# Patient Record
Sex: Female | Born: 1955 | Race: Black or African American | Hispanic: No | Smoking: Never smoker
Health system: Southern US, Community
[De-identification: ages and names within clinical notes are randomized; demographics above are authoritative.]

## PROBLEM LIST (undated history)

## (undated) DIAGNOSIS — I10 Essential (primary) hypertension: Secondary | ICD-10-CM

## (undated) DIAGNOSIS — E7800 Pure hypercholesterolemia, unspecified: Secondary | ICD-10-CM

## (undated) DIAGNOSIS — M791 Myalgia, unspecified site: Secondary | ICD-10-CM

## (undated) DIAGNOSIS — D700 Congenital agranulocytosis: Secondary | ICD-10-CM

## (undated) DIAGNOSIS — D649 Anemia, unspecified: Secondary | ICD-10-CM

## (undated) DIAGNOSIS — F411 Generalized anxiety disorder: Secondary | ICD-10-CM

## (undated) DIAGNOSIS — R7989 Other specified abnormal findings of blood chemistry: Secondary | ICD-10-CM

## (undated) DIAGNOSIS — T7840XA Allergy, unspecified, initial encounter: Secondary | ICD-10-CM

## (undated) DIAGNOSIS — M2550 Pain in unspecified joint: Secondary | ICD-10-CM

## (undated) DIAGNOSIS — R7303 Prediabetes: Secondary | ICD-10-CM

## (undated) HISTORY — DX: Anemia, unspecified: D64.9

## (undated) HISTORY — PX: CHOLECYSTECTOMY: SHX55

## (undated) HISTORY — DX: Pure hypercholesterolemia, unspecified: E78.00

## (undated) HISTORY — DX: Allergy, unspecified, initial encounter: T78.40XA

## (undated) HISTORY — DX: Prediabetes: R73.03

## (undated) HISTORY — DX: Congenital agranulocytosis: D70.0

## (undated) HISTORY — DX: Other specified abnormal findings of blood chemistry: R79.89

## (undated) HISTORY — DX: Pain in unspecified joint: M25.50

## (undated) HISTORY — DX: Generalized anxiety disorder: F41.1

## (undated) HISTORY — DX: Myalgia, unspecified site: M79.10

---

## 1997-11-04 ENCOUNTER — Emergency Department (HOSPITAL_COMMUNITY): Admission: EM | Admit: 1997-11-04 | Discharge: 1997-11-04 | Payer: Self-pay | Admitting: Emergency Medicine

## 1997-11-13 ENCOUNTER — Emergency Department (HOSPITAL_COMMUNITY): Admission: EM | Admit: 1997-11-13 | Discharge: 1997-11-13 | Payer: Self-pay | Admitting: Emergency Medicine

## 1998-03-13 ENCOUNTER — Ambulatory Visit (HOSPITAL_COMMUNITY): Admission: RE | Admit: 1998-03-13 | Discharge: 1998-03-13 | Payer: Self-pay | Admitting: Nephrology

## 1998-03-13 ENCOUNTER — Encounter: Payer: Self-pay | Admitting: Nephrology

## 1998-03-29 ENCOUNTER — Ambulatory Visit (HOSPITAL_COMMUNITY): Admission: RE | Admit: 1998-03-29 | Discharge: 1998-03-29 | Payer: Self-pay | Admitting: Nephrology

## 1998-03-29 ENCOUNTER — Encounter: Payer: Self-pay | Admitting: Nephrology

## 1998-04-10 ENCOUNTER — Ambulatory Visit (HOSPITAL_COMMUNITY): Admission: RE | Admit: 1998-04-10 | Discharge: 1998-04-10 | Payer: Self-pay | Admitting: General Surgery

## 1998-05-22 ENCOUNTER — Ambulatory Visit (HOSPITAL_COMMUNITY): Admission: RE | Admit: 1998-05-22 | Discharge: 1998-05-22 | Payer: Self-pay | Admitting: *Deleted

## 1998-06-21 ENCOUNTER — Encounter (HOSPITAL_BASED_OUTPATIENT_CLINIC_OR_DEPARTMENT_OTHER): Payer: Self-pay | Admitting: General Surgery

## 1998-06-24 ENCOUNTER — Encounter (HOSPITAL_BASED_OUTPATIENT_CLINIC_OR_DEPARTMENT_OTHER): Payer: Self-pay | Admitting: General Surgery

## 1998-06-24 ENCOUNTER — Ambulatory Visit (HOSPITAL_COMMUNITY): Admission: RE | Admit: 1998-06-24 | Discharge: 1998-06-25 | Payer: Self-pay | Admitting: General Surgery

## 1999-10-25 ENCOUNTER — Emergency Department (HOSPITAL_COMMUNITY): Admission: EM | Admit: 1999-10-25 | Discharge: 1999-10-25 | Payer: Self-pay | Admitting: *Deleted

## 1999-10-29 ENCOUNTER — Ambulatory Visit (HOSPITAL_COMMUNITY): Admission: AD | Admit: 1999-10-29 | Discharge: 1999-10-29 | Payer: Self-pay | Admitting: Obstetrics and Gynecology

## 1999-10-29 ENCOUNTER — Encounter (INDEPENDENT_AMBULATORY_CARE_PROVIDER_SITE_OTHER): Payer: Self-pay | Admitting: Specialist

## 1999-10-30 ENCOUNTER — Other Ambulatory Visit: Admission: RE | Admit: 1999-10-30 | Discharge: 1999-10-30 | Payer: Self-pay | Admitting: Obstetrics and Gynecology

## 1999-10-30 ENCOUNTER — Encounter (INDEPENDENT_AMBULATORY_CARE_PROVIDER_SITE_OTHER): Payer: Self-pay | Admitting: Specialist

## 2000-06-03 ENCOUNTER — Other Ambulatory Visit: Admission: RE | Admit: 2000-06-03 | Discharge: 2000-06-03 | Payer: Self-pay | Admitting: *Deleted

## 2000-09-13 ENCOUNTER — Other Ambulatory Visit: Admission: RE | Admit: 2000-09-13 | Discharge: 2000-09-13 | Payer: Self-pay | Admitting: *Deleted

## 2001-06-28 ENCOUNTER — Encounter: Payer: Self-pay | Admitting: Nephrology

## 2001-06-28 ENCOUNTER — Encounter: Admission: RE | Admit: 2001-06-28 | Discharge: 2001-06-28 | Payer: Self-pay | Admitting: Nephrology

## 2002-03-13 ENCOUNTER — Encounter: Payer: Self-pay | Admitting: Internal Medicine

## 2002-03-13 ENCOUNTER — Ambulatory Visit (HOSPITAL_COMMUNITY): Admission: RE | Admit: 2002-03-13 | Discharge: 2002-03-13 | Payer: Self-pay | Admitting: Internal Medicine

## 2002-12-13 ENCOUNTER — Encounter: Payer: Self-pay | Admitting: Internal Medicine

## 2002-12-13 ENCOUNTER — Ambulatory Visit (HOSPITAL_COMMUNITY): Admission: RE | Admit: 2002-12-13 | Discharge: 2002-12-13 | Payer: Self-pay | Admitting: Internal Medicine

## 2003-02-06 ENCOUNTER — Other Ambulatory Visit: Admission: RE | Admit: 2003-02-06 | Discharge: 2003-02-06 | Payer: Self-pay | Admitting: Family Medicine

## 2004-01-02 ENCOUNTER — Ambulatory Visit: Payer: Self-pay | Admitting: Nurse Practitioner

## 2004-01-25 ENCOUNTER — Ambulatory Visit: Payer: Self-pay | Admitting: Nurse Practitioner

## 2004-04-17 ENCOUNTER — Ambulatory Visit: Payer: Self-pay | Admitting: Nurse Practitioner

## 2004-05-02 ENCOUNTER — Ambulatory Visit: Payer: Self-pay | Admitting: Nurse Practitioner

## 2004-05-28 ENCOUNTER — Ambulatory Visit: Payer: Self-pay | Admitting: Nurse Practitioner

## 2004-07-21 ENCOUNTER — Ambulatory Visit: Payer: Self-pay | Admitting: Nurse Practitioner

## 2004-07-21 ENCOUNTER — Other Ambulatory Visit: Admission: RE | Admit: 2004-07-21 | Discharge: 2004-07-21 | Payer: Self-pay | Admitting: Family Medicine

## 2004-09-01 ENCOUNTER — Ambulatory Visit: Payer: Self-pay | Admitting: Nurse Practitioner

## 2005-01-22 ENCOUNTER — Ambulatory Visit: Payer: Self-pay | Admitting: Nurse Practitioner

## 2005-10-09 ENCOUNTER — Ambulatory Visit: Payer: Self-pay | Admitting: Nurse Practitioner

## 2005-10-09 ENCOUNTER — Other Ambulatory Visit: Admission: RE | Admit: 2005-10-09 | Discharge: 2005-10-09 | Payer: Self-pay | Admitting: Family Medicine

## 2005-10-12 ENCOUNTER — Encounter (INDEPENDENT_AMBULATORY_CARE_PROVIDER_SITE_OTHER): Payer: Self-pay | Admitting: Nurse Practitioner

## 2005-10-14 ENCOUNTER — Ambulatory Visit: Payer: Self-pay | Admitting: Nurse Practitioner

## 2006-12-08 ENCOUNTER — Encounter (INDEPENDENT_AMBULATORY_CARE_PROVIDER_SITE_OTHER): Payer: Self-pay | Admitting: *Deleted

## 2006-12-22 ENCOUNTER — Ambulatory Visit: Payer: Self-pay | Admitting: Internal Medicine

## 2006-12-23 ENCOUNTER — Ambulatory Visit (HOSPITAL_COMMUNITY): Admission: RE | Admit: 2006-12-23 | Discharge: 2006-12-23 | Payer: Self-pay | Admitting: Family Medicine

## 2006-12-23 ENCOUNTER — Ambulatory Visit: Payer: Self-pay | Admitting: *Deleted

## 2007-01-19 ENCOUNTER — Ambulatory Visit: Payer: Self-pay | Admitting: Family Medicine

## 2007-01-25 ENCOUNTER — Encounter (INDEPENDENT_AMBULATORY_CARE_PROVIDER_SITE_OTHER): Payer: Self-pay | Admitting: Nurse Practitioner

## 2007-01-25 DIAGNOSIS — E785 Hyperlipidemia, unspecified: Secondary | ICD-10-CM | POA: Insufficient documentation

## 2007-01-25 DIAGNOSIS — D649 Anemia, unspecified: Secondary | ICD-10-CM | POA: Insufficient documentation

## 2007-01-25 DIAGNOSIS — I10 Essential (primary) hypertension: Secondary | ICD-10-CM | POA: Insufficient documentation

## 2007-02-07 ENCOUNTER — Ambulatory Visit: Payer: Self-pay | Admitting: Internal Medicine

## 2007-03-09 ENCOUNTER — Ambulatory Visit: Payer: Self-pay | Admitting: Internal Medicine

## 2007-03-15 ENCOUNTER — Encounter (INDEPENDENT_AMBULATORY_CARE_PROVIDER_SITE_OTHER): Payer: Self-pay | Admitting: Nurse Practitioner

## 2007-03-15 ENCOUNTER — Ambulatory Visit: Payer: Self-pay | Admitting: Internal Medicine

## 2007-03-15 LAB — CONVERTED CEMR LAB
ALT: 16 U/L
AST: 18 U/L
Albumin: 4.3 g/dL
Alkaline Phosphatase: 36 U/L — ABNORMAL LOW
BUN: 16 mg/dL
Basophils Absolute: 0 K/uL
Basophils Relative: 1 %
CO2: 27 meq/L
Calcium: 9.4 mg/dL
Chloride: 104 meq/L
Creatinine, Ser: 0.83 mg/dL
Eosinophils Absolute: 0.1 K/uL — ABNORMAL LOW
Eosinophils Relative: 2 %
Glucose, Bld: 77 mg/dL
HCT: 35.4 % — ABNORMAL LOW
Helicobacter Pylori Antibody-IgG: 1.4 — ABNORMAL HIGH
Hemoglobin: 11.5 g/dL — ABNORMAL LOW
Lymphocytes Relative: 51 % — ABNORMAL HIGH
Lymphs Abs: 2.2 K/uL
MCHC: 32.5 g/dL
MCV: 76.8 fL — ABNORMAL LOW
Monocytes Absolute: 0.5 K/uL
Monocytes Relative: 12 %
Neutro Abs: 1.5 K/uL — ABNORMAL LOW
Neutrophils Relative %: 34 % — ABNORMAL LOW
Platelets: 251 K/uL
Potassium: 3.5 meq/L
RBC: 4.61 M/uL
RDW: 14.5 %
Sodium: 141 meq/L
Total Bilirubin: 0.3 mg/dL
Total Protein: 7.3 g/dL
WBC: 4.3 10*3/microliter

## 2007-04-07 ENCOUNTER — Ambulatory Visit: Payer: Self-pay | Admitting: Family Medicine

## 2007-06-09 ENCOUNTER — Ambulatory Visit: Payer: Self-pay | Admitting: Internal Medicine

## 2007-06-10 ENCOUNTER — Encounter (INDEPENDENT_AMBULATORY_CARE_PROVIDER_SITE_OTHER): Payer: Self-pay | Admitting: Nurse Practitioner

## 2007-06-10 LAB — CONVERTED CEMR LAB
Alkaline Phosphatase: 39 units/L (ref 39–117)
BUN: 18 mg/dL (ref 6–23)
CO2: 28 meq/L (ref 19–32)
Creatinine, Ser: 1 mg/dL (ref 0.40–1.20)
Eosinophils Absolute: 0 10*3/uL (ref 0.0–0.7)
Eosinophils Relative: 1 % (ref 0–5)
Glucose, Bld: 78 mg/dL (ref 70–99)
HCT: 36.8 % (ref 36.0–46.0)
Hemoglobin: 11.4 g/dL — ABNORMAL LOW (ref 12.0–15.0)
Lymphocytes Relative: 52 % — ABNORMAL HIGH (ref 12–46)
Lymphs Abs: 2.7 10*3/uL (ref 0.7–4.0)
MCV: 82 fL (ref 78.0–100.0)
Monocytes Absolute: 0.4 10*3/uL (ref 0.1–1.0)
Platelets: 268 10*3/uL (ref 150–400)
Preg, Serum: NEGATIVE
Sodium: 138 meq/L (ref 135–145)
TSH: 0.622 microintl units/mL (ref 0.350–5.50)
Total Bilirubin: 0.3 mg/dL (ref 0.3–1.2)
Total Protein: 7.4 g/dL (ref 6.0–8.3)
WBC: 5.3 10*3/uL (ref 4.0–10.5)

## 2007-07-12 ENCOUNTER — Encounter (INDEPENDENT_AMBULATORY_CARE_PROVIDER_SITE_OTHER): Payer: Self-pay | Admitting: Nurse Practitioner

## 2007-07-12 ENCOUNTER — Ambulatory Visit: Payer: Self-pay | Admitting: Family Medicine

## 2007-07-12 LAB — CONVERTED CEMR LAB
Cholesterol: 212 mg/dL — ABNORMAL HIGH (ref 0–200)
HDL: 58 mg/dL (ref >39)
LDL Cholesterol: 143 mg/dL — ABNORMAL HIGH (ref 0–99)
Triglycerides: 55 mg/dL (ref <150)

## 2007-10-14 ENCOUNTER — Emergency Department (HOSPITAL_COMMUNITY): Admission: EM | Admit: 2007-10-14 | Discharge: 2007-10-14 | Payer: Self-pay | Admitting: Emergency Medicine

## 2007-12-07 ENCOUNTER — Ambulatory Visit: Payer: Self-pay | Admitting: Internal Medicine

## 2007-12-07 LAB — CONVERTED CEMR LAB
ALT: 21 units/L (ref 0–35)
Alkaline Phosphatase: 41 units/L (ref 39–117)
Basophils Absolute: 0 10*3/uL (ref 0.0–0.1)
CO2: 24 meq/L (ref 19–32)
Creatinine, Ser: 0.91 mg/dL (ref 0.40–1.20)
Eosinophils Absolute: 0.1 10*3/uL (ref 0.0–0.7)
Eosinophils Relative: 2 % (ref 0–5)
Glucose, Bld: 72 mg/dL (ref 70–99)
HCT: 34.8 % — ABNORMAL LOW (ref 36.0–46.0)
Hemoglobin: 10.9 g/dL — ABNORMAL LOW (ref 12.0–15.0)
Lymphocytes Relative: 55 % — ABNORMAL HIGH (ref 12–46)
MCHC: 31.3 g/dL (ref 30.0–36.0)
MCV: 79.6 fL (ref 78.0–100.0)
Monocytes Absolute: 0.4 10*3/uL (ref 0.1–1.0)
Platelets: 234 10*3/uL (ref 150–400)
RDW: 15.5 % (ref 11.5–15.5)
Sodium: 141 meq/L (ref 135–145)
Total Bilirubin: 0.3 mg/dL (ref 0.3–1.2)
Total Protein: 7.3 g/dL (ref 6.0–8.3)

## 2007-12-21 ENCOUNTER — Ambulatory Visit: Payer: Self-pay | Admitting: Internal Medicine

## 2007-12-21 ENCOUNTER — Encounter (INDEPENDENT_AMBULATORY_CARE_PROVIDER_SITE_OTHER): Payer: Self-pay | Admitting: Adult Health

## 2007-12-21 ENCOUNTER — Encounter (INDEPENDENT_AMBULATORY_CARE_PROVIDER_SITE_OTHER): Payer: Self-pay | Admitting: Family Medicine

## 2007-12-21 LAB — CONVERTED CEMR LAB: Microalb, Ur: 0.61 mg/dL (ref 0.00–1.89)

## 2007-12-22 ENCOUNTER — Encounter (INDEPENDENT_AMBULATORY_CARE_PROVIDER_SITE_OTHER): Payer: Self-pay | Admitting: Family Medicine

## 2007-12-27 ENCOUNTER — Ambulatory Visit (HOSPITAL_COMMUNITY): Admission: RE | Admit: 2007-12-27 | Discharge: 2007-12-27 | Payer: Self-pay | Admitting: Family Medicine

## 2008-01-04 ENCOUNTER — Encounter (INDEPENDENT_AMBULATORY_CARE_PROVIDER_SITE_OTHER): Payer: Self-pay | Admitting: Internal Medicine

## 2008-01-04 ENCOUNTER — Ambulatory Visit: Payer: Self-pay | Admitting: Family Medicine

## 2008-01-04 LAB — CONVERTED CEMR LAB
Cholesterol: 169 mg/dL (ref 0–200)
HDL: 60 mg/dL (ref >39)
Total CHOL/HDL Ratio: 2.8
VLDL: 10 mg/dL (ref 0–40)

## 2008-01-09 ENCOUNTER — Ambulatory Visit: Payer: Self-pay | Admitting: Internal Medicine

## 2008-02-02 ENCOUNTER — Ambulatory Visit: Payer: Self-pay | Admitting: Internal Medicine

## 2008-02-03 ENCOUNTER — Encounter (INDEPENDENT_AMBULATORY_CARE_PROVIDER_SITE_OTHER): Payer: Self-pay | Admitting: Adult Health

## 2008-02-14 ENCOUNTER — Ambulatory Visit: Payer: Self-pay | Admitting: Internal Medicine

## 2008-02-27 ENCOUNTER — Encounter (INDEPENDENT_AMBULATORY_CARE_PROVIDER_SITE_OTHER): Payer: Self-pay | Admitting: Adult Health

## 2008-02-27 ENCOUNTER — Ambulatory Visit: Payer: Self-pay | Admitting: Internal Medicine

## 2008-02-27 LAB — CONVERTED CEMR LAB
AST: 29 units/L (ref 0–37)
Alkaline Phosphatase: 39 units/L (ref 39–117)
BUN: 10 mg/dL (ref 6–23)
Basophils Absolute: 0 10*3/uL (ref 0.0–0.1)
Basophils Relative: 1 % (ref 0–1)
Calcium: 9.2 mg/dL (ref 8.4–10.5)
Creatinine, Ser: 0.77 mg/dL (ref 0.40–1.20)
Eosinophils Absolute: 0.1 10*3/uL (ref 0.0–0.7)
Eosinophils Relative: 1 % (ref 0–5)
Hemoglobin: 11.1 g/dL — ABNORMAL LOW (ref 12.0–15.0)
MCHC: 30.8 g/dL (ref 30.0–36.0)
MCV: 77.6 fL — ABNORMAL LOW (ref 78.0–100.0)
Monocytes Absolute: 0.4 10*3/uL (ref 0.1–1.0)
Monocytes Relative: 11 % (ref 3–12)
RBC: 4.64 M/uL (ref 3.87–5.11)
RDW: 15.8 % — ABNORMAL HIGH (ref 11.5–15.5)
Total Bilirubin: 0.3 mg/dL (ref 0.3–1.2)

## 2008-03-19 ENCOUNTER — Ambulatory Visit: Payer: Self-pay | Admitting: Internal Medicine

## 2008-04-19 ENCOUNTER — Ambulatory Visit: Payer: Self-pay | Admitting: Internal Medicine

## 2008-05-21 ENCOUNTER — Ambulatory Visit: Payer: Self-pay | Admitting: Internal Medicine

## 2008-05-21 ENCOUNTER — Encounter (INDEPENDENT_AMBULATORY_CARE_PROVIDER_SITE_OTHER): Payer: Self-pay | Admitting: Adult Health

## 2008-05-21 LAB — CONVERTED CEMR LAB
ALT: 19 units/L (ref 0–35)
BUN: 14 mg/dL (ref 6–23)
CO2: 19 meq/L (ref 19–32)
Calcium: 9.5 mg/dL (ref 8.4–10.5)
Chloride: 104 meq/L (ref 96–112)
Cholesterol: 171 mg/dL (ref 0–200)
Creatinine, Ser: 0.75 mg/dL (ref 0.40–1.20)
Glucose, Bld: 92 mg/dL (ref 70–99)
Total CHOL/HDL Ratio: 3.1
Triglycerides: 40 mg/dL (ref <150)

## 2008-05-22 ENCOUNTER — Ambulatory Visit (HOSPITAL_COMMUNITY): Admission: RE | Admit: 2008-05-22 | Discharge: 2008-05-22 | Payer: Self-pay | Admitting: Internal Medicine

## 2008-06-26 ENCOUNTER — Encounter (INDEPENDENT_AMBULATORY_CARE_PROVIDER_SITE_OTHER): Payer: Self-pay | Admitting: Adult Health

## 2008-06-26 ENCOUNTER — Ambulatory Visit: Payer: Self-pay | Admitting: Internal Medicine

## 2008-06-26 LAB — CONVERTED CEMR LAB
Eosinophils Absolute: 0.1 10*3/uL (ref 0.0–0.7)
Eosinophils Relative: 3 % (ref 0–5)
HCT: 34.5 % — ABNORMAL LOW (ref 36.0–46.0)
Lymphs Abs: 2.2 10*3/uL (ref 0.7–4.0)
MCV: 78.1 fL (ref 78.0–100.0)
Monocytes Relative: 9 % (ref 3–12)
Platelets: 229 10*3/uL (ref 150–400)
RBC: 4.42 M/uL (ref 3.87–5.11)
WBC: 4.2 10*3/uL (ref 4.0–10.5)

## 2008-07-25 ENCOUNTER — Ambulatory Visit: Payer: Self-pay | Admitting: Obstetrics & Gynecology

## 2008-07-26 ENCOUNTER — Encounter: Payer: Self-pay | Admitting: Obstetrics and Gynecology

## 2008-07-26 LAB — CONVERTED CEMR LAB
GC Probe Amp, Genital: NEGATIVE
Hepatitis B Surface Ag: NEGATIVE

## 2008-07-27 ENCOUNTER — Encounter: Payer: Self-pay | Admitting: Obstetrics and Gynecology

## 2008-07-27 LAB — CONVERTED CEMR LAB

## 2009-01-25 ENCOUNTER — Ambulatory Visit (HOSPITAL_COMMUNITY): Admission: RE | Admit: 2009-01-25 | Discharge: 2009-01-25 | Payer: Self-pay | Admitting: Internal Medicine

## 2009-01-28 ENCOUNTER — Encounter (INDEPENDENT_AMBULATORY_CARE_PROVIDER_SITE_OTHER): Payer: Self-pay | Admitting: Adult Health

## 2009-01-28 ENCOUNTER — Ambulatory Visit: Payer: Self-pay | Admitting: Family Medicine

## 2009-01-28 LAB — CONVERTED CEMR LAB
ALT: 19 units/L (ref 0–35)
Albumin: 4.6 g/dL (ref 3.5–5.2)
BUN: 14 mg/dL (ref 6–23)
CO2: 20 meq/L (ref 19–32)
Calcium: 9.8 mg/dL (ref 8.4–10.5)
Chloride: 103 meq/L (ref 96–112)
Cholesterol: 236 mg/dL — ABNORMAL HIGH (ref 0–200)
Creatinine, Ser: 0.84 mg/dL (ref 0.40–1.20)
Eosinophils Absolute: 0 10*3/uL (ref 0.0–0.7)
Eosinophils Relative: 1 % (ref 0–5)
HCT: 40.1 % (ref 36.0–46.0)
Lymphs Abs: 2.1 10*3/uL (ref 0.7–4.0)
MCV: 79.6 fL (ref 78.0–100.0)
Microalb, Ur: 1.35 mg/dL (ref 0.00–1.89)
Monocytes Relative: 10 % (ref 3–12)
RBC: 5.04 M/uL (ref 3.87–5.11)
Total CHOL/HDL Ratio: 4.1
WBC: 4.4 10*3/uL (ref 4.0–10.5)

## 2009-01-29 ENCOUNTER — Ambulatory Visit: Payer: Self-pay | Admitting: Internal Medicine

## 2009-01-31 ENCOUNTER — Encounter (INDEPENDENT_AMBULATORY_CARE_PROVIDER_SITE_OTHER): Payer: Self-pay | Admitting: Adult Health

## 2009-02-01 ENCOUNTER — Ambulatory Visit (HOSPITAL_COMMUNITY): Admission: RE | Admit: 2009-02-01 | Discharge: 2009-02-01 | Payer: Self-pay | Admitting: Internal Medicine

## 2009-02-27 ENCOUNTER — Ambulatory Visit: Payer: Self-pay | Admitting: Internal Medicine

## 2009-02-27 ENCOUNTER — Encounter (INDEPENDENT_AMBULATORY_CARE_PROVIDER_SITE_OTHER): Payer: Self-pay | Admitting: Adult Health

## 2009-02-27 LAB — CONVERTED CEMR LAB
AST: 19 units/L (ref 0–37)
Alkaline Phosphatase: 31 units/L — ABNORMAL LOW (ref 39–117)
BUN: 13 mg/dL (ref 6–23)
Calcium: 9.9 mg/dL (ref 8.4–10.5)
Creatinine, Ser: 0.9 mg/dL (ref 0.40–1.20)
Glucose, Bld: 106 mg/dL — ABNORMAL HIGH (ref 70–99)
HDL: 49 mg/dL (ref >39)
LDL Cholesterol: 93 mg/dL (ref 0–99)
Total CHOL/HDL Ratio: 3.1
Triglycerides: 59 mg/dL (ref <150)

## 2009-04-29 ENCOUNTER — Ambulatory Visit: Payer: Self-pay | Admitting: Family Medicine

## 2009-04-29 LAB — CONVERTED CEMR LAB: Sed Rate: 8 mm/hr (ref 0–22)

## 2009-07-15 ENCOUNTER — Ambulatory Visit: Payer: Self-pay | Admitting: Internal Medicine

## 2009-07-15 ENCOUNTER — Encounter (INDEPENDENT_AMBULATORY_CARE_PROVIDER_SITE_OTHER): Payer: Self-pay | Admitting: Adult Health

## 2009-07-15 LAB — CONVERTED CEMR LAB
Alkaline Phosphatase: 30 units/L — ABNORMAL LOW (ref 39–117)
BUN: 12 mg/dL (ref 6–23)
Basophils Absolute: 0 10*3/uL (ref 0.0–0.1)
Basophils Relative: 1 % (ref 0–1)
CO2: 23 meq/L (ref 19–32)
Cholesterol: 224 mg/dL — ABNORMAL HIGH (ref 0–200)
Creatinine, Ser: 0.78 mg/dL (ref 0.40–1.20)
Eosinophils Absolute: 0.1 10*3/uL (ref 0.0–0.7)
Glucose, Bld: 97 mg/dL (ref 70–99)
HDL: 60 mg/dL (ref >39)
Hemoglobin: 11.6 g/dL — ABNORMAL LOW (ref 12.0–15.0)
LDL Cholesterol: 153 mg/dL — ABNORMAL HIGH (ref 0–99)
MCHC: 32.2 g/dL (ref 30.0–36.0)
MCV: 79.8 fL (ref 78.0–100.0)
Monocytes Absolute: 0.3 10*3/uL (ref 0.1–1.0)
Monocytes Relative: 8 % (ref 3–12)
Neutro Abs: 1 10*3/uL — ABNORMAL LOW (ref 1.7–7.7)
RDW: 15.5 % (ref 11.5–15.5)
Sodium: 139 meq/L (ref 135–145)
Total Bilirubin: 0.3 mg/dL (ref 0.3–1.2)
Total CHOL/HDL Ratio: 3.7
Total Protein: 6.9 g/dL (ref 6.0–8.3)
Triglycerides: 57 mg/dL (ref <150)
VLDL: 11 mg/dL (ref 0–40)

## 2009-07-18 ENCOUNTER — Ambulatory Visit (HOSPITAL_COMMUNITY): Admission: RE | Admit: 2009-07-18 | Discharge: 2009-07-18 | Payer: Self-pay | Admitting: Internal Medicine

## 2009-09-27 ENCOUNTER — Ambulatory Visit: Payer: Self-pay | Admitting: Obstetrics and Gynecology

## 2009-10-02 ENCOUNTER — Ambulatory Visit (HOSPITAL_COMMUNITY): Admission: RE | Admit: 2009-10-02 | Discharge: 2009-10-02 | Payer: Self-pay | Admitting: Family Medicine

## 2009-10-07 ENCOUNTER — Ambulatory Visit: Payer: Self-pay | Admitting: Internal Medicine

## 2009-10-07 ENCOUNTER — Encounter (INDEPENDENT_AMBULATORY_CARE_PROVIDER_SITE_OTHER): Payer: Self-pay | Admitting: Adult Health

## 2009-10-07 LAB — CONVERTED CEMR LAB
ALT: 18 units/L (ref 0–35)
AST: 20 units/L (ref 0–37)
Albumin: 4.3 g/dL (ref 3.5–5.2)
Alkaline Phosphatase: 32 units/L — ABNORMAL LOW (ref 39–117)
BUN: 14 mg/dL (ref 6–23)
Basophils Absolute: 0 10*3/uL (ref 0.0–0.1)
Basophils Relative: 0 % (ref 0–1)
Calcium: 9.9 mg/dL (ref 8.4–10.5)
Chloride: 100 meq/L (ref 96–112)
Eosinophils Absolute: 0 10*3/uL (ref 0.0–0.7)
HDL: 52 mg/dL (ref >39)
LDL Cholesterol: 97 mg/dL (ref 0–99)
MCHC: 32.7 g/dL (ref 30.0–36.0)
MCV: 78.5 fL (ref 78.0–100.0)
Monocytes Relative: 11 % (ref 3–12)
Neutro Abs: 1.4 10*3/uL — ABNORMAL LOW (ref 1.7–7.7)
Neutrophils Relative %: 37 % — ABNORMAL LOW (ref 43–77)
Platelets: 215 10*3/uL (ref 150–400)
Potassium: 3.7 meq/L (ref 3.5–5.3)
RBC: 4.79 M/uL (ref 3.87–5.11)
RDW: 16.3 % — ABNORMAL HIGH (ref 11.5–15.5)
Total CHOL/HDL Ratio: 3.1

## 2009-10-25 ENCOUNTER — Ambulatory Visit: Payer: Self-pay | Admitting: Obstetrics & Gynecology

## 2009-11-08 ENCOUNTER — Ambulatory Visit: Payer: Self-pay | Admitting: Obstetrics & Gynecology

## 2010-01-29 ENCOUNTER — Ambulatory Visit (HOSPITAL_COMMUNITY)
Admission: RE | Admit: 2010-01-29 | Discharge: 2010-01-29 | Payer: Self-pay | Source: Home / Self Care | Admitting: Internal Medicine

## 2010-02-19 ENCOUNTER — Ambulatory Visit (HOSPITAL_COMMUNITY)
Admission: RE | Admit: 2010-02-19 | Discharge: 2010-02-19 | Payer: Self-pay | Source: Home / Self Care | Admitting: Family Medicine

## 2010-04-13 ENCOUNTER — Encounter: Payer: Self-pay | Admitting: Internal Medicine

## 2010-06-08 LAB — POCT PREGNANCY, URINE: Preg Test, Ur: NEGATIVE

## 2010-08-05 NOTE — Group Therapy Note (Signed)
Beverly Kramer, Beverly Kramer NO.:  [id number]   MEDICAL RECORD NO.:  [id number]          PATIENT TYPE:  WOC   LOCATION:  [facility]                   FACILITY:  [facility]   PHYSICIAN:  Odie Sera, D.O.    DATE OF BIRTH:  July 08, 1955   DATE OF SERVICE:                                  CLINIC NOTE   Patient presents as a new patient referred by her PCP, Dr. Rickard Rhymes, for recurrent Candidal vaginitis.  Patient reports that she  has had recurrent yeast infections and vaginal itching over the past 3  months for which she has been treated multiple times with vaginal  suppository creams as well as antifungal pills and reports that Vistaril  is really the only thing that helps with the vaginal itching.  She  currently denies any vaginal itching and states that her symptoms are  relatively well controlled currently.  She also reports an associated  thick, copious, white, vaginal discharge when she has the itching.  She  denies any fevers, chills, or weight loss.   PAST MEDICAL HISTORY:  1. Hyperlipidemia.  2. Hypertension.  3. Iron deficiency anemia.  4. Seasonal allergies.  5. Vitamin D deficiency.  6. She denies any past medical history of diabetes.  7. She also denies any history of sexually transmitted diseases or any      history of abnormal Pap smears.   GYNECOLOGIC HISTORY:  Menarche was at age 63.  She continues to have  periods, which are regular and occur every 30 days, lasting 4 days, and  usually heavy flow.  She denies any history of abnormal Pap smears.   OBSTETRIC HISTORY:  She is a G4, P2-0-2-2.  She had 2 spontaneous  abortions and 2 living children.  She is currently not on any form of  birth control and does not desire to be on any.   PAST SURGICAL HISTORY:  1. Cholecystectomy in 2000.  2. Lipoma resection in 2006 on her right foot.   FAMILY HISTORY:  Father had a heart attack, and there is also a family  history of hypertension.   SOCIAL  HISTORY:  She occasionally drinks alcohol socially.  She denies  any tobacco or drug use.  She has only had 2 sexual partners.  She is  currently monogamous with her fiance over the past 2 years.  She denies  any history of sexual or physical abuse.  Patient is from [place] and has  2 children.   REVIEW OF SYSTEMS:  A 14-point review of systems was reviewed with the  patient and is negative except as noted in the History of Present  Illness.   ALLERGIES:  No known drug allergies.   MEDICATIONS:  Include:  1. Loratadine.  2. Hydrochlorothiazide 25 mg daily.  3. Calcium.  4. Supplemental iron daily.  5. Vitamin D 1000 International Units daily.  6. Vistaril 25 mg as needed for itching.  7. Cod Liver Oil pills as needed.  8. Pravastatin 40 mg daily.   PHYSICAL EXAMINATION:  VITAL SIGNS:  Temperature is 97.6, pulse 83,  blood pressure is 107/76, weight is 171.6  pounds, height is 5 feet 2  inches, respirations 24.  GENERAL:  Patient is an [place] American female.  She is obese but in no  acute distress.  HEENT:  Mucous membranes are moist.  NECK:  Supple, there is no thyromegaly.  LUNGS:  Clear to auscultation bilaterally.  CARDIOVASCULAR:  Regular rate and rhythm, no murmurs.  ABDOMEN:  Obese, soft, nontender, and nondistended.  Bowel sounds are  present.  There is no guarding or rebound.  GU:  Speculum exam reveals normal cervix with no lesions or ulcerations,  minimal milky-white discharge, no lesions or masses in the vaginal  vault, and no lesions on the external vagina.  Bimanual exam was also  nontender and no masses palpated.  EXTREMITIES:  No clubbing and no cyanosis and 2+ peripheral pulses.  SKIN:  No rashes or lesions.   ASSESSMENT:  Recurrent Candidal vaginitis.   PLAN:  We will follow up on wet prep results as well as STD screening,  which included gonorrhea and Chlamydia as well as HIV, RPR, and  hepatitis panel.  We will also want to rule out diabetes mellitus with  a  hemoglobin A1c level today.  We will call the patient when these results  are back and if they are abnormal.  Given that the patient is currently  asymptomatic, we will not prescribe any treatment at the present time.  The patient is to follow up with her PCP on all of her other chronic  medical problems.  She was also encouraged to get a routine screening  colonoscopy since she has never had one.     ______________________________  Binnie Kand, NP    ______________________________  Odie Sera, D.O.    CC/MEDQ  D:  07/25/2008  T:  07/25/2008  Job:  [id number]

## 2010-08-08 NOTE — Op Note (Signed)
[facility]  Patient:    Beverly Kramer, Beverly Kramer                      MRN: [id number] Proc. Date: 10/29/99 Adm. Date:  [id number] Attending:  Leonard Schwartz                           Operative Report  PREOPERATIVE DIAGNOSES:       1. An 11-1/2-weeks gestation.                               2. Inevitable abortion.  POSTOPERATIVE DIAGNOSES:      1. An 11-1/2-weeks gestation.                               2. Inevitable abortion.  PROCEDURE:                    Suction dilatation and evacuation.  SURGEON:                      Janine Limbo, M.D.  ANESTHESIA:                   IV sedation and paracervical block.  INDICATIONS:                  The patient is a 55 year old who presents at 11-1/2-weeks gestation by her last period, but who has only a gestational sac with no fetal pole by ultrasound.  She has had bleeding and cramping, and on examination today she was found to have products of conception protruding from an open cervix.  The patient understands the indications for her procedure, and she accepts the risks of, but not limited to, anesthetic complications, bleeding, infection, and possible damage to the surrounding organs.  FINDINGS:                     The uterus was six weeks size.  The cervix was open and products of conception were present at the os.  The patient is Rh positive.  DESCRIPTION OF PROCEDURE:     The patient was taken to the operating room where she was given medication through her IV line.  The perineum was prepped with Betadine, as was the vagina.  The bladder was drained of urine.  The patient was sterilely draped.  A paracervical block was placed using 10 cc of 1% Xylocaine.  The uterus was sounded to 8.0 cm.  A #8 suction curet was used to evacuate the uterine cavity.  A medium sharp curet was then used to curet the cavity.  The uterus was felt to be clean at the end of our procedure. Hemostasis was noted to be  adequate.  All instruments were removed.  The patient was returned to the supine position, and then taken to the recovery room in stable condition.  FOLLOWUP INSTRUCTIONS:        The patient will take ibuprofen as needed for pain.  She was given a prescription for Vicodin one to two p.o. q.4h. p.r.n. severe pain.  She will return to see Dr. Stefano Gaul in two to three weeks, for a follow-up examination. DD:  10/29/99 TD:  10/30/99 Job: [id number]

## 2010-12-19 LAB — DIFFERENTIAL
Lymphocytes Relative: 16
Lymphs Abs: 1.2
Neutro Abs: 5.5
Neutrophils Relative %: 76

## 2010-12-19 LAB — COMPREHENSIVE METABOLIC PANEL
CO2: 27
Calcium: 9.4
Creatinine, Ser: 0.92
GFR calc non Af Amer: >60
Glucose, Bld: 110 — ABNORMAL HIGH
Total Protein: 7

## 2010-12-19 LAB — URINALYSIS, ROUTINE W REFLEX MICROSCOPIC
Glucose, UA: NEGATIVE
Ketones, ur: NEGATIVE
Nitrite: NEGATIVE
Protein, ur: NEGATIVE
pH: 7.5

## 2010-12-19 LAB — CBC
Hemoglobin: 12.3
MCHC: 33.2
MCV: 77.8 — ABNORMAL LOW
RBC: 4.77
RDW: 16.4 — ABNORMAL HIGH

## 2010-12-19 LAB — URINE MICROSCOPIC-ADD ON

## 2010-12-19 LAB — LIPASE, BLOOD: Lipase: 17

## 2011-01-13 ENCOUNTER — Other Ambulatory Visit (HOSPITAL_COMMUNITY): Payer: Self-pay | Admitting: Family Medicine

## 2011-01-13 DIAGNOSIS — Z1231 Encounter for screening mammogram for malignant neoplasm of breast: Secondary | ICD-10-CM

## 2011-02-02 ENCOUNTER — Ambulatory Visit (HOSPITAL_COMMUNITY)
Admission: RE | Admit: 2011-02-02 | Discharge: 2011-02-02 | Disposition: A | Discharge status: Home / Self Care | Payer: 59 | Source: Ambulatory Visit | Attending: Family Medicine | Admitting: Family Medicine

## 2011-02-02 DIAGNOSIS — Z1231 Encounter for screening mammogram for malignant neoplasm of breast: Secondary | ICD-10-CM | POA: Insufficient documentation

## 2011-04-10 ENCOUNTER — Ambulatory Visit (INDEPENDENT_AMBULATORY_CARE_PROVIDER_SITE_OTHER): Payer: 59

## 2011-04-10 DIAGNOSIS — R52 Pain, unspecified: Secondary | ICD-10-CM

## 2011-04-10 DIAGNOSIS — L0231 Cutaneous abscess of buttock: Secondary | ICD-10-CM

## 2011-04-11 ENCOUNTER — Ambulatory Visit (INDEPENDENT_AMBULATORY_CARE_PROVIDER_SITE_OTHER): Payer: 59

## 2011-04-11 DIAGNOSIS — L0231 Cutaneous abscess of buttock: Secondary | ICD-10-CM

## 2011-05-27 ENCOUNTER — Encounter: Payer: Self-pay | Admitting: Physician Assistant

## 2011-05-27 ENCOUNTER — Ambulatory Visit (INDEPENDENT_AMBULATORY_CARE_PROVIDER_SITE_OTHER): Payer: 59 | Admitting: Physician Assistant

## 2011-05-27 DIAGNOSIS — I10 Essential (primary) hypertension: Secondary | ICD-10-CM

## 2011-05-27 DIAGNOSIS — E782 Mixed hyperlipidemia: Secondary | ICD-10-CM

## 2011-05-27 DIAGNOSIS — Z01419 Encounter for gynecological examination (general) (routine) without abnormal findings: Secondary | ICD-10-CM

## 2011-05-27 DIAGNOSIS — Z0000 Encounter for general adult medical examination without abnormal findings: Secondary | ICD-10-CM

## 2011-05-27 DIAGNOSIS — E785 Hyperlipidemia, unspecified: Secondary | ICD-10-CM

## 2011-05-27 DIAGNOSIS — M79609 Pain in unspecified limb: Secondary | ICD-10-CM

## 2011-05-27 LAB — COMPREHENSIVE METABOLIC PANEL
AST: 31 U/L (ref 0–37)
Albumin: 4.6 g/dL (ref 3.5–5.2)
Alkaline Phosphatase: 42 U/L (ref 39–117)
BUN: 16 mg/dL (ref 6–23)
Calcium: 9.7 mg/dL (ref 8.4–10.5)
Chloride: 103 mEq/L (ref 96–112)
Potassium: 3.9 mEq/L (ref 3.5–5.3)
Sodium: 138 mEq/L (ref 135–145)
Total Protein: 7.8 g/dL (ref 6.0–8.3)

## 2011-05-27 LAB — POCT URINALYSIS DIPSTICK
Glucose, UA: NEGATIVE
Nitrite, UA: NEGATIVE
Protein, UA: NEGATIVE
Spec Grav, UA: 1.025
Urobilinogen, UA: 0.2
pH, UA: 7

## 2011-05-27 LAB — LIPID PANEL
HDL: 60 mg/dL (ref >39)
LDL Cholesterol: 140 mg/dL — ABNORMAL HIGH (ref 0–99)
Triglycerides: 38 mg/dL (ref <150)

## 2011-05-27 LAB — TSH: TSH: 1.389 u[IU]/mL (ref 0.350–4.500)

## 2011-05-27 MED ORDER — SIMVASTATIN 40 MG PO TABS: 40.0000 mg | ORAL_TABLET | Freq: Every evening | ORAL | Status: DC

## 2011-05-27 MED ORDER — FLUTICASONE PROPIONATE 50 MCG/ACT NA SUSP: 2.0000 | Freq: Every day | NASAL | Status: DC

## 2011-05-27 MED ORDER — HYDROCHLOROTHIAZIDE 25 MG PO TABS: 25.0000 mg | ORAL_TABLET | Freq: Every day | ORAL | Status: DC

## 2011-05-27 NOTE — Patient Instructions (Signed)
Age-appropriate guidance given 

## 2011-05-27 NOTE — Progress Notes (Signed)
  Subjective:    Patient ID: Beverly Kramer, female    DOB: February 27, 1956, 56 y.o.   MRN: [id number]  HPI  See scanned in sheet.  History of B foot surgery in 2006.  I'm unclear exactly what she had done.  She would like to be referred to see a foot specialist.  UTD on MMG, colonoscopy.  Review of Systems see scanned in form     Objective:   Physical Exam  Nursing note and vitals reviewed. Constitutional: She is oriented to person, place, and time. She appears well-developed and well-nourished.  HENT:  Head: Normocephalic and atraumatic.  Right Ear: External ear normal.  Left Ear: External ear normal.  Mouth/Throat: Oropharynx is clear and moist. No oropharyngeal exudate (PND present).       Turbinates pale and boggy  Eyes: Conjunctivae and EOM are normal. Pupils are equal, round, and reactive to light.  Neck: Normal range of motion. Neck supple. No thyromegaly present.  Cardiovascular: Normal rate, regular rhythm and normal heart sounds.  Exam reveals no gallop and no friction rub.   No murmur heard. Pulmonary/Chest: Effort normal and breath sounds normal. Right breast exhibits no inverted nipple, no mass, no nipple discharge, no skin change and no tenderness. Left breast exhibits no inverted nipple, no mass, no nipple discharge, no skin change and no tenderness.  Abdominal: Soft. Bowel sounds are normal. She exhibits no distension and no mass. There is no tenderness. There is no rebound and no guarding.  Genitourinary: Vagina normal and uterus normal. No vaginal discharge found.       Patient's menses is ending, small amount of blood in vault. Bimanual exam unremarkable.  Pap smear taken  Musculoskeletal: Normal range of motion. She exhibits no edema and no tenderness.  Lymphadenopathy:       Head (right side): No tonsillar, no preauricular and no occipital adenopathy present.       Head (left side): No tonsillar, no preauricular and no occipital adenopathy present.    She has no  cervical adenopathy.    She has no axillary adenopathy.       Right: No supraclavicular adenopathy present.       Left: No supraclavicular adenopathy present.  Neurological: She is alert and oriented to person, place, and time. No cranial nerve deficit. Coordination normal.  Skin: Skin is warm and dry.  Psychiatric: She has a normal mood and affect. Her behavior is normal.          Assessment & Plan:  CPE-set up bone density.  Due MMG in summer/fall Hypertension-stable Hypercholesterolemia-stable Will refer for feet.

## 2011-05-28 LAB — CBC WITH DIFFERENTIAL/PLATELET
Basophils Absolute: 0 10*3/uL (ref 0.0–0.1)
HCT: 36.4 % (ref 36.0–46.0)
Hemoglobin: 11.5 g/dL — ABNORMAL LOW (ref 12.0–15.0)
Lymphocytes Relative: 57 % — ABNORMAL HIGH (ref 12–46)
Lymphs Abs: 1.8 10*3/uL (ref 0.7–4.0)
Monocytes Absolute: 0.3 10*3/uL (ref 0.1–1.0)
Monocytes Relative: 9 % (ref 3–12)
Neutro Abs: 1 10*3/uL — ABNORMAL LOW (ref 1.7–7.7)
RBC: 4.74 MIL/uL (ref 3.87–5.11)
RDW: 16.5 % — ABNORMAL HIGH (ref 11.5–15.5)
WBC: 3.1 10*3/uL — ABNORMAL LOW (ref 4.0–10.5)

## 2011-05-29 LAB — PAP IG W/ RFLX HPV ASCU

## 2011-06-26 ENCOUNTER — Ambulatory Visit (HOSPITAL_COMMUNITY)
Admission: RE | Admit: 2011-06-26 | Discharge: 2011-06-26 | Disposition: A | Discharge status: Home / Self Care | Payer: 59 | Source: Ambulatory Visit | Attending: Physician Assistant | Admitting: Physician Assistant

## 2011-06-26 DIAGNOSIS — Z0000 Encounter for general adult medical examination without abnormal findings: Secondary | ICD-10-CM

## 2011-08-28 ENCOUNTER — Ambulatory Visit (INDEPENDENT_AMBULATORY_CARE_PROVIDER_SITE_OTHER): Payer: 59 | Admitting: Family Medicine

## 2011-08-28 ENCOUNTER — Encounter: Payer: Self-pay | Admitting: Family Medicine

## 2011-08-28 VITALS — BP 135/83 | HR 75 | Temp 98.4°F | Resp 16 | Ht 62.0 in | Wt 158.4 lb

## 2011-08-28 DIAGNOSIS — R21 Rash and other nonspecific skin eruption: Secondary | ICD-10-CM

## 2011-08-28 DIAGNOSIS — L0291 Cutaneous abscess, unspecified: Secondary | ICD-10-CM

## 2011-08-28 DIAGNOSIS — L0390 Cellulitis, unspecified: Secondary | ICD-10-CM

## 2011-08-28 MED ORDER — SULFAMETHOXAZOLE-TRIMETHOPRIM 800-160 MG PO TABS: 1.0000 | ORAL_TABLET | Freq: Two times a day (BID) | ORAL | Status: AC

## 2011-08-28 NOTE — Patient Instructions (Addendum)
Keep lesion clean.  Soaks in salt water   If you get more places or this one gets worse return.

## 2011-08-28 NOTE — Progress Notes (Signed)
Subjective: Patient is here complaining of a rash between her legs. She has a regular partner but he has not had any lesions. She had an I&D in January for a MRSA abscess.  Objective: Cystic abscess of left upper medial thigh, a less and she seems in diameter, tender but not severely inflamed. Distal to that is a area that has a little sore tomorrow dry peeling skin where the abscess has resolved. On her buttock there is ab nodular skin lesion where the previous I&D was done apparently  Assessment: Cyst, presumably MRSA, left medial thigh  Plan: I&D Culture culture Antibiotics Return if abscess persists, or taking the antibiotic has not helped the area that is bumpy

## 2011-09-01 ENCOUNTER — Encounter: Payer: Self-pay | Admitting: Family Medicine

## 2011-09-01 LAB — WOUND CULTURE

## 2012-01-25 ENCOUNTER — Other Ambulatory Visit: Payer: Self-pay | Admitting: Physician Assistant

## 2012-01-26 ENCOUNTER — Telehealth: Payer: Self-pay

## 2012-01-26 NOTE — Telephone Encounter (Signed)
Pt states her pharmacy, [facility], has sent over refill request twice electronically for "high blood pressure pills" No response from [facility], pt is out and states needs now Please call pt at [phone]

## 2012-01-27 NOTE — Telephone Encounter (Signed)
We sent this RX in on the 01/25/12. I called pt she states the pharmacy didn't receive RX. I called the pharmacy and they have Rx. Advised pt to call

## 2012-02-08 ENCOUNTER — Other Ambulatory Visit: Payer: Self-pay | Admitting: Physician Assistant

## 2012-02-08 DIAGNOSIS — Z1231 Encounter for screening mammogram for malignant neoplasm of breast: Secondary | ICD-10-CM

## 2012-02-25 ENCOUNTER — Ambulatory Visit (HOSPITAL_COMMUNITY)
Admission: RE | Admit: 2012-02-25 | Discharge: 2012-02-25 | Disposition: A | Discharge status: Home / Self Care | Payer: 59 | Source: Ambulatory Visit | Attending: Physician Assistant | Admitting: Physician Assistant

## 2012-02-25 DIAGNOSIS — Z1231 Encounter for screening mammogram for malignant neoplasm of breast: Secondary | ICD-10-CM | POA: Insufficient documentation

## 2012-02-26 ENCOUNTER — Ambulatory Visit: Payer: 59

## 2012-02-26 ENCOUNTER — Ambulatory Visit (INDEPENDENT_AMBULATORY_CARE_PROVIDER_SITE_OTHER): Payer: 59 | Admitting: Internal Medicine

## 2012-02-26 VITALS — BP 120/80 | HR 70 | Temp 98.3°F | Resp 16 | Ht 61.0 in | Wt 159.6 lb

## 2012-02-26 DIAGNOSIS — M79671 Pain in right foot: Secondary | ICD-10-CM

## 2012-02-26 DIAGNOSIS — I10 Essential (primary) hypertension: Secondary | ICD-10-CM

## 2012-02-26 DIAGNOSIS — M25569 Pain in unspecified knee: Secondary | ICD-10-CM

## 2012-02-26 DIAGNOSIS — L853 Xerosis cutis: Secondary | ICD-10-CM

## 2012-02-26 DIAGNOSIS — E785 Hyperlipidemia, unspecified: Secondary | ICD-10-CM

## 2012-02-26 MED ORDER — SIMVASTATIN 40 MG PO TABS: 40.0000 mg | ORAL_TABLET | Freq: Every evening | ORAL | Status: DC

## 2012-02-26 MED ORDER — MELOXICAM 15 MG PO TABS: 15.0000 mg | ORAL_TABLET | Freq: Every day | ORAL | Status: DC

## 2012-02-26 MED ORDER — HYDROCHLOROTHIAZIDE 25 MG PO TABS: 25.0000 mg | ORAL_TABLET | Freq: Every day | ORAL | Status: DC

## 2012-02-26 NOTE — Progress Notes (Signed)
  Subjective:    Patient ID: Beverly Kramer, female    DOB: [birth year], 56 y.o.   MRN: [id number]  HPI complaining of one month of right sided knee pain following a fall directly on concrete after tripping No swelling/no give way sensation/pain with stairs/pain with walking/pain with getting up from sitting or squatting/no prior injury  Also continues to complain of bilateral foot pain-see prior evaluation/declined podiatry she was sent to Has ongoing problems they get worse at work  History of hypertension-needs medication refill/note last office visit  Denies chest pain palpitations or peripheral edema  History of hyperlipidemia-needs medication refills/not currently fasting  Complaining of dry skin with itching all over-present for many years without treatment   Review of Systems No fever chills or night sweats No weight loss No headaches or visual changes No cardiac or respiratory symptoms No gastrointestinal symptoms No genitourinary symptoms    Objective:   Physical Exam Vital signs stable HEENT clear Right knee with abrasion over patella The knee has a full range of motion There is no instability to stressors McMurray negative Lachman negative bAllotment of patellar intact Mildly tender to pressure over the infra-patellar tendon  [facility] reading (PRIMARY) by  Dr. Merla Riches no bony abnormality      Assessment & Plan:   1. Knee pain  DG Knee Complete 4 Views Right  2. Foot pain, bilateral    3. HTN (hypertension)    4. Other and unspecified hyperlipidemia    5. Dry skin     Meds ordered this encounter  Medications  . Multiple Vitamins-Minerals (MULTIVITAMIN WITH MINERALS) tablet    Sig: Take 1 tablet by mouth daily.  . hydrochlorothiazide (HYDRODIURIL) 25 MG tablet    Sig: Take 1 tablet (25 mg total) by mouth daily.    Dispense:  90 tablet    Refill:  0  . simvastatin (ZOCOR) 40 MG tablet    Sig: Take 1 tablet (40 mg total) by mouth every evening.   Dispense:  90 tablet    Refill:  1  . meloxicam (MOBIC) 15 MG tablet    Sig: Take 1 tablet (15 mg total) by mouth daily.    Dispense:  30 tablet    Refill:  0   2 followup for eval at 104 in 2 months for fasting lab work Exercises given for infrapatellar abrasion with resulting tendinitis If not well in 3 weeks consider physical therapy 2 use adequate moisturizer for dry skin

## 2012-02-26 NOTE — Patient Instructions (Signed)
curel for skin

## 2012-03-02 NOTE — Progress Notes (Signed)
Left msg for pt to call to schedule January f/u appt with Marylene Land. Lennon Alstrom

## 2012-03-04 NOTE — Progress Notes (Signed)
Sent pt a reminder letter to schedule follow up appt.Beverly Kramer

## 2012-04-01 ENCOUNTER — Encounter: Payer: Self-pay | Admitting: Family Medicine

## 2012-04-01 ENCOUNTER — Other Ambulatory Visit: Payer: Self-pay | Admitting: Family Medicine

## 2012-04-01 ENCOUNTER — Ambulatory Visit (INDEPENDENT_AMBULATORY_CARE_PROVIDER_SITE_OTHER): Payer: 59 | Admitting: Family Medicine

## 2012-04-01 VITALS — BP 160/100 | HR 71 | Temp 98.6°F | Resp 16 | Ht 60.5 in | Wt 164.0 lb

## 2012-04-01 DIAGNOSIS — I10 Essential (primary) hypertension: Secondary | ICD-10-CM

## 2012-04-01 DIAGNOSIS — E785 Hyperlipidemia, unspecified: Secondary | ICD-10-CM

## 2012-04-01 DIAGNOSIS — D649 Anemia, unspecified: Secondary | ICD-10-CM

## 2012-04-01 DIAGNOSIS — Z23 Encounter for immunization: Secondary | ICD-10-CM

## 2012-04-01 DIAGNOSIS — J40 Bronchitis, not specified as acute or chronic: Secondary | ICD-10-CM

## 2012-04-01 DIAGNOSIS — Z79899 Other long term (current) drug therapy: Secondary | ICD-10-CM

## 2012-04-01 DIAGNOSIS — L0291 Cutaneous abscess, unspecified: Secondary | ICD-10-CM

## 2012-04-01 LAB — CBC WITH DIFFERENTIAL/PLATELET
Basophils Absolute: 0 10*3/uL (ref 0.0–0.1)
Eosinophils Absolute: 0.1 10*3/uL (ref 0.0–0.7)
Eosinophils Relative: 2 % (ref 0–5)
HCT: 36.3 % (ref 36.0–46.0)
MCH: 25.2 pg — ABNORMAL LOW (ref 26.0–34.0)
MCHC: 32.8 g/dL (ref 30.0–36.0)
MCV: 76.7 fL — ABNORMAL LOW (ref 78.0–100.0)
Monocytes Absolute: 0.3 10*3/uL (ref 0.1–1.0)
Platelets: 250 10*3/uL (ref 150–400)
RDW: 17 % — ABNORMAL HIGH (ref 11.5–15.5)

## 2012-04-01 LAB — COMPREHENSIVE METABOLIC PANEL
AST: 25 U/L (ref 0–37)
BUN: 14 mg/dL (ref 6–23)
Calcium: 9.7 mg/dL (ref 8.4–10.5)
Chloride: 103 mEq/L (ref 96–112)
Creat: 0.71 mg/dL (ref 0.50–1.10)
Total Bilirubin: 0.5 mg/dL (ref 0.3–1.2)

## 2012-04-01 LAB — LIPID PANEL
Cholesterol: 165 mg/dL (ref 0–200)
HDL: 56 mg/dL (ref >39)
LDL Cholesterol: 102 mg/dL — ABNORMAL HIGH (ref 0–99)
Total CHOL/HDL Ratio: 2.9 Ratio
Triglycerides: 36 mg/dL (ref <150)
VLDL: 7 mg/dL (ref 0–40)

## 2012-04-01 MED ORDER — FLUTICASONE PROPIONATE 50 MCG/ACT NA SUSP: 2.0000 | Freq: Every day | NASAL | Status: DC

## 2012-04-01 MED ORDER — SIMVASTATIN 40 MG PO TABS: 40.0000 mg | ORAL_TABLET | Freq: Every evening | ORAL | Status: DC

## 2012-04-01 MED ORDER — CHLORHEXIDINE GLUCONATE 4 % EX LIQD: 1.0000 "application " | Freq: Every day | CUTANEOUS | Status: AC

## 2012-04-01 MED ORDER — BENZONATATE 200 MG PO CAPS: 200.0000 mg | ORAL_CAPSULE | Freq: Three times a day (TID) | ORAL | Status: DC | PRN

## 2012-04-01 MED ORDER — MUPIROCIN CALCIUM 2 % NA OINT: TOPICAL_OINTMENT | Freq: Two times a day (BID) | NASAL | Status: DC

## 2012-04-01 MED ORDER — HYDROCHLOROTHIAZIDE 25 MG PO TABS: 25.0000 mg | ORAL_TABLET | Freq: Every day | ORAL | Status: DC

## 2012-04-01 MED ORDER — DOXYCYCLINE HYCLATE 100 MG PO TABS: 100.0000 mg | ORAL_TABLET | Freq: Two times a day (BID) | ORAL | Status: DC

## 2012-04-01 NOTE — Progress Notes (Signed)
  Subjective:    Patient ID: Beverly Kramer, female    DOB: 13-May-1955, 57 y.o.   MRN: [id number] Chief Complaint  Patient presents with  . Hypertension  . Medication Refill  . Hyperlipidemia    HPI  Cold for 2 wks, no f/c, no sore throat, no nasal congestion, cough non-productive but feeling ill, tired, no abd pain C/o rash.  Still getting periods monthly. Taking mvi and iron supp once daily.  Review of Systems    BP 160/100  Pulse 71  Temp(Src) 98.6 F (37 C) (Oral)  Resp 16  Ht 5' 0.5" (1.537 m)  Wt 164 lb (74.39 kg)  BMI 31.49 kg/m2  SpO2 100%  LMP 04/22/2011 Objective:   Physical Exam        Assessment & Plan:  Abscess - Plan: doxycycline (VIBRA-TABS) 100 MG tablet, chlorhexidine (HIBICLENS) 4 % external liquid, mupirocin nasal ointment (BACTROBAN) 2 %  HYPERTENSION - Plan: Comprehensive metabolic panel, hydrochlorothiazide (HYDRODIURIL) 25 MG tablet  DYSLIPIDEMIA - Plan: Lipid panel, simvastatin (ZOCOR) 40 MG tablet  Encounter for long-term (current) use of other medications  ANEMIA-NOS - Plan: CBC with Differential  Bronchitis - Plan: fluticasone (FLONASE) 50 MCG/ACT nasal spray, benzonatate (TESSALON) 200 MG capsule  Need for prophylactic vaccination and inoculation against influenza - Plan: Flu vaccine greater than or equal to 3yo with preservative IM  Meds ordered this encounter  Medications  . IRON PO    Sig: Take by mouth daily.  Marland Kitchen BIOTIN PO    Sig: Take 10,000 mcg by mouth.  . doxycycline (VIBRA-TABS) 100 MG tablet    Sig: Take 1 tablet (100 mg total) by mouth 2 (two) times daily.    Dispense:  20 tablet    Refill:  0  . chlorhexidine (HIBICLENS) 4 % external liquid    Sig: Apply 15 mLs (1 application total) topically daily. For 5 days    Dispense:  473 mL    Refill:  0  . mupirocin nasal ointment (BACTROBAN) 2 %    Sig: Place into the nose 2 (two) times daily. Use one-half of tube in each nare x 5d. press sides of nose together and  massage after applying.    Dispense:  10 g    Refill:  0  . fluticasone (FLONASE) 50 MCG/ACT nasal spray    Sig: Place 2 sprays into the nose daily.    Dispense:  16 g    Refill:  6  . hydrochlorothiazide (HYDRODIURIL) 25 MG tablet    Sig: Take 1 tablet (25 mg total) by mouth daily.    Dispense:  90 tablet    Refill:  1  . simvastatin (ZOCOR) 40 MG tablet    Sig: Take 1 tablet (40 mg total) by mouth every evening.    Dispense:  90 tablet    Refill:  1  . benzonatate (TESSALON) 200 MG capsule    Sig: Take 1 capsule (200 mg total) by mouth 3 (three) times daily as needed for cough.    Dispense:  30 capsule    Refill:  0

## 2012-04-05 ENCOUNTER — Encounter: Payer: Self-pay | Admitting: *Deleted

## 2012-07-06 ENCOUNTER — Ambulatory Visit (INDEPENDENT_AMBULATORY_CARE_PROVIDER_SITE_OTHER): Payer: 59 | Admitting: Family Medicine

## 2012-07-06 ENCOUNTER — Telehealth: Payer: Self-pay

## 2012-07-06 VITALS — BP 139/90 | HR 75 | Temp 98.0°F | Resp 16 | Ht 61.0 in | Wt 163.0 lb

## 2012-07-06 DIAGNOSIS — I10 Essential (primary) hypertension: Secondary | ICD-10-CM

## 2012-07-06 DIAGNOSIS — M7741 Metatarsalgia, right foot: Secondary | ICD-10-CM

## 2012-07-06 DIAGNOSIS — M2140 Flat foot [pes planus] (acquired), unspecified foot: Secondary | ICD-10-CM

## 2012-07-06 DIAGNOSIS — M7750 Other enthesopathy of unspecified foot: Secondary | ICD-10-CM

## 2012-07-06 MED ORDER — HYDROCHLOROTHIAZIDE 25 MG PO TABS: 25.0000 mg | ORAL_TABLET | Freq: Every day | ORAL | Status: DC

## 2012-07-06 NOTE — Telephone Encounter (Signed)
Pt is calling because she is in pain and is wanting to be referred to another dr.

## 2012-07-06 NOTE — Telephone Encounter (Signed)
Left message for her to call me back. 

## 2012-07-06 NOTE — Telephone Encounter (Signed)
Where is she painful? Where does she want to be referred?

## 2012-07-06 NOTE — Progress Notes (Signed)
Chief complaint: Bilateral foot pain  History of present illness: The patient has had foot pain and swelling for quite some time. Patient states that she notices that at the end of a long day. 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 Patient states that she can be on her feet for the full 10 hour shift with less than 15 minutes of sitting. Patient denies any injury to the feet. Patient denies any swelling of the cast. Patient denies any shortness of breath or chest pain. Patient has tried some meloxicam which has been beneficial. Patient denies any numbness, and tingling but unfortunately has dull chronic pain.  Past medical history, social, surgical and family history all reviewed.   Physical exam Blood pressure 139/90, pulse 75, temperature 98 F (36.7 C), temperature source Oral, resp. rate 16, height 5\' 1"  (1.549 m), weight 163 lb (73.936 kg), SpO2 97.00%. General: No apparent distress alert and oriented x3 mood and affect normal Respiratory: Patient's speak in full sentences and does not appear short of breath Skin: Warm dry intact with no signs of infection or rash Neuro: Cranial nerves II through XII are intact, neurovascularly intact in all extremities with 2+ DTRs and 2+ pulses. Foot exam: On inspection patient does have severe pes planus with over pronation of the hindfoot bilaterally right greater than left. Patient does have some mild dorsal swelling but nothing significant. Patient has no swelling of the ankle and has full range of motion. She is neurovascularly intact distally with 2+ Achilles tendon reflex.  Assessment: Pes planus, metatarsalgia.  Plan: arch straps given          reccommended orthotics  Home exercises given, discussed icing protocol  on the way out patient wwanted to have refill of HTN meds, did refill but will follow up with PCP within the next month.

## 2012-07-06 NOTE — Telephone Encounter (Signed)
Patient is having pain in foot and would like to be referred to a foot specialist.  i recommended that she come into clinic for evaluation and discuss with the physician where she should be referred to.  She will be coming in today.

## 2012-07-06 NOTE — Telephone Encounter (Signed)
Patient states she will come in for the foot pain.

## 2012-07-06 NOTE — Patient Instructions (Addendum)
Very nice to meet you I am giving you some arch straps that will help keep your feet in a more neutral position.  I also want you to go get some Spenco orthotics.  They cost about $30 dollars.  You can get them at [employer].  I also am gving you some exercises I would like you to do daily.  You should ice your feet for 20 minutes two times a day. You can take a little motrin if you need it.  Come back and see Dr. Clelia Croft for your other problems.

## 2012-07-19 ENCOUNTER — Other Ambulatory Visit: Payer: Self-pay | Admitting: Physician Assistant

## 2012-09-28 ENCOUNTER — Other Ambulatory Visit: Payer: Self-pay | Admitting: Family Medicine

## 2012-09-28 ENCOUNTER — Ambulatory Visit
Admission: RE | Admit: 2012-09-28 | Discharge: 2012-09-28 | Disposition: A | Discharge status: Home / Self Care | Payer: 59 | Source: Ambulatory Visit | Attending: Family Medicine | Admitting: Family Medicine

## 2012-09-28 DIAGNOSIS — M549 Dorsalgia, unspecified: Secondary | ICD-10-CM

## 2012-10-07 ENCOUNTER — Encounter: Payer: 59 | Admitting: Family Medicine

## 2012-12-01 ENCOUNTER — Other Ambulatory Visit (HOSPITAL_COMMUNITY)
Admission: RE | Admit: 2012-12-01 | Discharge: 2012-12-01 | Disposition: A | Discharge status: Home / Self Care | Payer: 59 | Source: Ambulatory Visit | Attending: Nurse Practitioner | Admitting: Nurse Practitioner

## 2012-12-01 ENCOUNTER — Other Ambulatory Visit: Payer: Self-pay | Admitting: Nurse Practitioner

## 2012-12-01 DIAGNOSIS — Z01419 Encounter for gynecological examination (general) (routine) without abnormal findings: Secondary | ICD-10-CM | POA: Insufficient documentation

## 2012-12-01 DIAGNOSIS — Z1151 Encounter for screening for human papillomavirus (HPV): Secondary | ICD-10-CM | POA: Insufficient documentation

## 2012-12-12 ENCOUNTER — Emergency Department (HOSPITAL_COMMUNITY)
Admission: EM | Admit: 2012-12-12 | Discharge: 2012-12-12 | Disposition: A | Discharge status: Home / Self Care | Payer: 59 | Attending: Emergency Medicine | Admitting: Emergency Medicine

## 2012-12-12 ENCOUNTER — Encounter (HOSPITAL_COMMUNITY): Payer: Self-pay | Admitting: *Deleted

## 2012-12-12 DIAGNOSIS — Y9389 Activity, other specified: Secondary | ICD-10-CM | POA: Insufficient documentation

## 2012-12-12 DIAGNOSIS — T148XXA Other injury of unspecified body region, initial encounter: Secondary | ICD-10-CM

## 2012-12-12 DIAGNOSIS — S0990XA Unspecified injury of head, initial encounter: Secondary | ICD-10-CM | POA: Insufficient documentation

## 2012-12-12 DIAGNOSIS — Y92410 Unspecified street and highway as the place of occurrence of the external cause: Secondary | ICD-10-CM | POA: Insufficient documentation

## 2012-12-12 DIAGNOSIS — I10 Essential (primary) hypertension: Secondary | ICD-10-CM | POA: Insufficient documentation

## 2012-12-12 DIAGNOSIS — Z79899 Other long term (current) drug therapy: Secondary | ICD-10-CM | POA: Insufficient documentation

## 2012-12-12 HISTORY — DX: Essential (primary) hypertension: I10

## 2012-12-12 MED ORDER — HYDROCODONE-ACETAMINOPHEN 5-325 MG PO TABS: 1.0000 | ORAL_TABLET | ORAL | Status: DC | PRN

## 2012-12-12 MED ORDER — METHOCARBAMOL 750 MG PO TABS: 750.0000 mg | ORAL_TABLET | Freq: Four times a day (QID) | ORAL | Status: DC
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
## 2013-02-10 ENCOUNTER — Other Ambulatory Visit (HOSPITAL_COMMUNITY): Payer: Self-pay | Admitting: Family Medicine

## 2013-02-10 DIAGNOSIS — Z1231 Encounter for screening mammogram for malignant neoplasm of breast: Secondary | ICD-10-CM

## 2013-02-27 ENCOUNTER — Ambulatory Visit (HOSPITAL_COMMUNITY)
Admission: RE | Admit: 2013-02-27 | Discharge: 2013-02-27 | Disposition: A | Discharge status: Home / Self Care | Payer: 59 | Source: Ambulatory Visit | Attending: Family Medicine | Admitting: Family Medicine

## 2013-02-27 DIAGNOSIS — Z1231 Encounter for screening mammogram for malignant neoplasm of breast: Secondary | ICD-10-CM | POA: Insufficient documentation

## 2014-07-13 ENCOUNTER — Other Ambulatory Visit (HOSPITAL_COMMUNITY): Payer: Self-pay | Admitting: Family Medicine

## 2014-07-13 DIAGNOSIS — Z1231 Encounter for screening mammogram for malignant neoplasm of breast: Secondary | ICD-10-CM

## 2014-07-18 ENCOUNTER — Ambulatory Visit (HOSPITAL_COMMUNITY)
Admission: RE | Admit: 2014-07-18 | Discharge: 2014-07-18 | Disposition: A | Discharge status: Home / Self Care | Payer: 59 | Source: Ambulatory Visit | Attending: Family Medicine | Admitting: Family Medicine

## 2014-07-18 DIAGNOSIS — Z1231 Encounter for screening mammogram for malignant neoplasm of breast: Secondary | ICD-10-CM | POA: Diagnosis present

## 2015-08-13 ENCOUNTER — Other Ambulatory Visit: Payer: Self-pay

## 2015-08-13 DIAGNOSIS — Z1231 Encounter for screening mammogram for malignant neoplasm of breast: Secondary | ICD-10-CM

## 2015-09-03 ENCOUNTER — Ambulatory Visit: Payer: Self-pay

## 2015-09-23 ENCOUNTER — Other Ambulatory Visit: Payer: Self-pay | Admitting: Family Medicine

## 2015-09-23 DIAGNOSIS — Z1231 Encounter for screening mammogram for malignant neoplasm of breast: Secondary | ICD-10-CM

## 2015-10-03 ENCOUNTER — Ambulatory Visit
Admission: RE | Admit: 2015-10-03 | Discharge: 2015-10-03 | Disposition: A | Discharge status: Home / Self Care | Payer: BLUE CROSS/BLUE SHIELD | Source: Ambulatory Visit | Attending: Family Medicine | Admitting: Family Medicine

## 2015-10-03 DIAGNOSIS — Z1231 Encounter for screening mammogram for malignant neoplasm of breast: Secondary | ICD-10-CM

## 2015-10-21 ENCOUNTER — Ambulatory Visit
Admission: RE | Admit: 2015-10-21 | Discharge: 2015-10-21 | Disposition: A | Discharge status: Home / Self Care | Payer: No Typology Code available for payment source | Source: Ambulatory Visit | Attending: Infectious Disease | Admitting: Infectious Disease

## 2015-10-21 ENCOUNTER — Other Ambulatory Visit: Payer: Self-pay | Admitting: Infectious Disease

## 2015-10-21 DIAGNOSIS — A150 Tuberculosis of lung: Secondary | ICD-10-CM

## 2016-03-27 ENCOUNTER — Ambulatory Visit
Admission: RE | Admit: 2016-03-27 | Discharge: 2016-03-27 | Disposition: A | Discharge status: Home / Self Care | Payer: BLUE CROSS/BLUE SHIELD | Source: Ambulatory Visit | Attending: Family Medicine | Admitting: Family Medicine

## 2016-03-27 ENCOUNTER — Other Ambulatory Visit: Payer: Self-pay | Admitting: Family Medicine

## 2016-03-27 DIAGNOSIS — Z227 Latent tuberculosis: Secondary | ICD-10-CM

## 2016-09-21 ENCOUNTER — Other Ambulatory Visit: Payer: Self-pay | Admitting: Family Medicine

## 2016-09-21 DIAGNOSIS — Z1231 Encounter for screening mammogram for malignant neoplasm of breast: Secondary | ICD-10-CM

## 2016-10-05 ENCOUNTER — Ambulatory Visit
Admission: RE | Admit: 2016-10-05 | Discharge: 2016-10-05 | Disposition: A | Discharge status: Home / Self Care | Payer: BLUE CROSS/BLUE SHIELD | Source: Ambulatory Visit | Attending: Family Medicine | Admitting: Family Medicine

## 2016-10-05 ENCOUNTER — Encounter: Payer: Self-pay | Admitting: Radiology

## 2016-10-05 DIAGNOSIS — Z1231 Encounter for screening mammogram for malignant neoplasm of breast: Secondary | ICD-10-CM

## 2016-11-06 ENCOUNTER — Other Ambulatory Visit: Payer: Self-pay | Admitting: Family Medicine

## 2016-11-06 ENCOUNTER — Other Ambulatory Visit (HOSPITAL_COMMUNITY)
Admission: RE | Admit: 2016-11-06 | Discharge: 2016-11-06 | Disposition: A | Discharge status: Home / Self Care | Payer: BLUE CROSS/BLUE SHIELD | Source: Ambulatory Visit | Attending: Family Medicine | Admitting: Family Medicine

## 2016-11-06 DIAGNOSIS — Z01411 Encounter for gynecological examination (general) (routine) with abnormal findings: Secondary | ICD-10-CM | POA: Diagnosis not present

## 2016-11-10 LAB — CYTOLOGY - PAP
Diagnosis: NEGATIVE
HPV: NOT DETECTED

## 2016-12-09 ENCOUNTER — Ambulatory Visit (INDEPENDENT_AMBULATORY_CARE_PROVIDER_SITE_OTHER): Payer: BLUE CROSS/BLUE SHIELD

## 2016-12-09 ENCOUNTER — Ambulatory Visit (INDEPENDENT_AMBULATORY_CARE_PROVIDER_SITE_OTHER): Payer: BLUE CROSS/BLUE SHIELD | Admitting: Podiatry

## 2016-12-09 DIAGNOSIS — M79672 Pain in left foot: Secondary | ICD-10-CM | POA: Diagnosis not present

## 2016-12-09 DIAGNOSIS — M79671 Pain in right foot: Secondary | ICD-10-CM

## 2016-12-09 DIAGNOSIS — M722 Plantar fascial fibromatosis: Secondary | ICD-10-CM

## 2016-12-09 MED ORDER — NONFORMULARY OR COMPOUNDED ITEM: 0 refills | Status: AC

## 2016-12-13 NOTE — Progress Notes (Signed)
   Subjective: Patient is a 61 year old female presented today as a new patient with a complaint of intermittent burning pain and tenderness in the plantar aspect of bilateral heels that began about 6 months ago. Patient states that it hurts in the mornings with the first steps out of bed. Patient presents today for further treatment and evaluation.   Past Medical History:  Diagnosis Date  . Allergy   . Hypertension     Objective: Physical Exam General: The patient is alert and oriented x3 in no acute distress.  Dermatology: Skin is warm, dry and supple bilateral lower extremities. Negative for open lesions or macerations bilateral.   Vascular: Dorsalis Pedis and Posterior Tibial pulses palpable bilateral.  Capillary fill time is immediate to all digits.  Neurological: Epicritic and protective threshold intact bilateral.   Musculoskeletal: Tenderness to palpation at the medial calcaneal tubercale and through the insertion of the plantar fascia of the right foot. All other joints range of motion within normal limits bilateral. Strength 5/5 in all groups bilateral.   Radiographic exam: Normal osseous mineralization. Joint spaces preserved. No fracture/dislocation/boney destruction. Calcaneal spur present with mild thickening of plantar fascia right. No other soft tissue abnormalities or radiopaque foreign bodies.   Assessment: 1. Plantar fasciitis right 2. Pain in right foot  Plan of Care:  1. Patient evaluated. Xrays reviewed.   2. Patient declined injection stating they did not help in the past. 3. Plantar fascial brace dispensed. 4. Cannot tolerate oral NSAIDs. 5. Prescription for anti-inflammatory pain cream to be dispensed from [facility]. 6. Appointment with Raiford Noble for custom molded orthotics. 7. Return to clinic when necessary.   Felecia Shelling, DPM [facility]  Dr. Felecia Shelling, DPM    [address] [place], [place] [address]                Office [phone]  Fax [phone]

## 2016-12-15 ENCOUNTER — Other Ambulatory Visit: Payer: BLUE CROSS/BLUE SHIELD | Admitting: Orthotics

## 2016-12-16 ENCOUNTER — Ambulatory Visit (INDEPENDENT_AMBULATORY_CARE_PROVIDER_SITE_OTHER): Payer: BLUE CROSS/BLUE SHIELD | Admitting: Orthotics

## 2016-12-16 DIAGNOSIS — M722 Plantar fascial fibromatosis: Secondary | ICD-10-CM | POA: Diagnosis not present

## 2016-12-17 NOTE — Progress Notes (Signed)

## 2016-12-29 ENCOUNTER — Other Ambulatory Visit: Payer: Self-pay | Admitting: Podiatry

## 2016-12-29 DIAGNOSIS — M722 Plantar fascial fibromatosis: Secondary | ICD-10-CM

## 2017-01-06 ENCOUNTER — Ambulatory Visit: Payer: BLUE CROSS/BLUE SHIELD | Admitting: Orthotics

## 2017-01-06 DIAGNOSIS — M722 Plantar fascial fibromatosis: Secondary | ICD-10-CM

## 2017-01-06 NOTE — Progress Notes (Signed)
Patient came in today to pick up custom made foot orthotics.  The goals were accomplished and the patient reported no dissatisfaction with said orthotics.  Patient was advised of breakin period and how to report any issues. 

## 2017-10-12 ENCOUNTER — Ambulatory Visit
Admission: RE | Admit: 2017-10-12 | Discharge: 2017-10-12 | Disposition: A | Discharge status: Home / Self Care | Payer: BLUE CROSS/BLUE SHIELD | Source: Ambulatory Visit | Attending: Family Medicine | Admitting: Family Medicine

## 2017-10-12 ENCOUNTER — Other Ambulatory Visit: Payer: Self-pay | Admitting: Family Medicine

## 2017-10-12 DIAGNOSIS — Z1231 Encounter for screening mammogram for malignant neoplasm of breast: Secondary | ICD-10-CM

## 2017-10-20 DIAGNOSIS — M65312 Trigger thumb, left thumb: Secondary | ICD-10-CM | POA: Insufficient documentation

## 2017-10-20 DIAGNOSIS — M19041 Primary osteoarthritis, right hand: Secondary | ICD-10-CM | POA: Insufficient documentation

## 2018-01-22 DIAGNOSIS — M25562 Pain in left knee: Secondary | ICD-10-CM | POA: Insufficient documentation

## 2018-02-24 DIAGNOSIS — M542 Cervicalgia: Secondary | ICD-10-CM | POA: Insufficient documentation

## 2018-04-22 ENCOUNTER — Other Ambulatory Visit: Payer: Self-pay

## 2018-04-22 ENCOUNTER — Ambulatory Visit (INDEPENDENT_AMBULATORY_CARE_PROVIDER_SITE_OTHER): Payer: BLUE CROSS/BLUE SHIELD | Admitting: Family Medicine

## 2018-04-22 ENCOUNTER — Encounter: Payer: Self-pay | Admitting: Family Medicine

## 2018-04-22 VITALS — BP 150/90 | HR 75 | Temp 98.5°F | Ht 62.0 in | Wt 172.0 lb

## 2018-04-22 DIAGNOSIS — K0889 Other specified disorders of teeth and supporting structures: Secondary | ICD-10-CM | POA: Diagnosis not present

## 2018-04-22 DIAGNOSIS — H6122 Impacted cerumen, left ear: Secondary | ICD-10-CM

## 2018-04-22 DIAGNOSIS — M25562 Pain in left knee: Secondary | ICD-10-CM

## 2018-04-22 DIAGNOSIS — H9203 Otalgia, bilateral: Secondary | ICD-10-CM

## 2018-04-22 MED ORDER — AMOXICILLIN 500 MG PO CAPS: 500.0000 mg | ORAL_CAPSULE | Freq: Three times a day (TID) | ORAL | 0 refills | Status: AC

## 2018-04-22 NOTE — Progress Notes (Signed)
Subjective:    Patient ID: Beverly Kramer, female    DOB: [birth year], 63 y.o.   MRN: [id number]  HPI Beverly Kramer is a 63 y.o. female Presents today for: Chief Complaint  Patient presents with  . Otalgia    1 month off and on  . Knee Pain    ongoing. worse last night.   Otalgia: Pain in both ears for 1 month.  Feels like drainage with swallowing or chewing, but no external d/c  Filling in tooth on lower left awhile ago. Treated by dentist in October. Treated with antibiotic, but not followed up with dentist. Swelling in face at times with chewing. Looked swollen last night. No measured fevers.  Hearing ok.   L knee pain: No fall / injury.  3 months of symptoms.  Occasional alleve, or ibuprofen.  Ortho has injected twice - only helped 30 mins to 1 hour.  Has seen ortho, but not recently. Recommended MRI, but has not had done. [employer], and unable to follow up with prior ortho. Needs new referral.  Next appt with new PCP 05/12/18.      Patient Active Problem List   Diagnosis Date Noted  . DYSLIPIDEMIA 01/25/2007  . ANEMIA-NOS 01/25/2007  . HYPERTENSION 01/25/2007   Past Medical History:  Diagnosis Date  . Allergy   . Hypertension    Past Surgical History:  Procedure Laterality Date  . CHOLECYSTECTOMY     per pt 10+ years ago   No Known Allergies Prior to Admission medications   Medication Sig Start Date End Date Taking? Authorizing Provider  amLODipine (NORVASC) 5 MG tablet Take 5 mg by mouth daily.   Yes [provider]  Cinnamon 500 MG capsule Cinnamon   Yes [provider]  hydrochlorothiazide (HYDRODIURIL) 25 MG tablet TAKE 1 TABLET BY MOUTH ONCE DAILY FOR 90 DAYS 04/18/18  Yes [provider]  losartan (COZAAR) 100 MG tablet TAKE 1 TABLET BY MOUTH ONCE DAILY FOR 90 DAYS 04/18/18  Yes [provider]  losartan-hydrochlorothiazide (HYZAAR) 100-25 MG tablet  09/18/16  Yes [provider]  meloxicam  (MOBIC) 15 MG tablet meloxicam 15 mg tablet   Yes [provider]  NONFORMULARY OR COMPOUNDED ITEM [facility]  [place] Tendonitis Cream- Diclofenac 3%, Baclofen 2%, Bupivacaine 1%, Doxepin 5%, Gabapentin 6%, Ibuprofen 3%, Pentoxifylline 3% Apply 1-2 grams to affected area 3-4 times daily Qty. 120 gm 3 refills 12/09/16  Yes Felecia ShellingEvans, Brent M, DPM  rifampin (RIFADIN) 300 MG capsule Take by mouth.   Yes [provider]  simvastatin (ZOCOR) 40 MG tablet simvastatin 40 mg tablet 08/18/17  Yes [provider]   Social History   Socioeconomic History  . Marital status: Divorced    Spouse name: Not on file  . Number of children: Not on file  . Years of education: Not on file  . Highest education level: Not on file  Occupational History  . Not on file  Social Needs  . Financial resource strain: Not on file  . Food insecurity:    Worry: Not on file    Inability: Not on file  . Transportation needs:    Medical: Not on file    Non-medical: Not on file  Tobacco Use  . Smoking status: Never Smoker  . Smokeless tobacco: Never Used  Substance and Sexual Activity  . Alcohol use: No  . Drug use: No  . Sexual activity: Not on file  Lifestyle  . Physical activity:  Days per week: Not on file    Minutes per session: Not on file  . Stress: Not on file  Relationships  . Social connections:    Talks on phone: Not on file    Gets together: Not on file    Attends religious service: Not on file    Active member of club or organization: Not on file    Attends meetings of clubs or organizations: Not on file    Relationship status: Not on file  . Intimate partner violence:    Fear of current or ex partner: Not on file    Emotionally abused: Not on file    Physically abused: Not on file    Forced sexual activity: Not on file  Other Topics Concern  . Not on file  Social History Narrative  . Not on file     Review of Systems Per HPI     Objective:    Physical Exam Vitals signs reviewed.  Constitutional:      General: She is not in acute distress.    Appearance: She is well-developed.  HENT:     Head: Normocephalic and atraumatic.     Right Ear: Hearing, tympanic membrane, ear canal and external ear normal.     Left Ear: Hearing, ear canal and external ear normal. There is impacted cerumen.     Nose: Nose normal.     Mouth/Throat:     Pharynx: No oropharyngeal exudate.     Comments: Small area of soft tissue swelling approximately 4 to 5 mm wide lateral/buccal mucosal side of second premolar on the right upper.  No active discharge, no discharge from around the base of the tooth.  No obvious/apparent dental caries at present.  Jaw nontender, no clicking/clunk with TMJ, TMJ nontender.  Pain-free range of motion of jaw.  No apparent facial swelling noted Eyes:     Conjunctiva/sclera: Conjunctivae normal.     Pupils: Pupils are equal, round, and reactive to light.  Neck:     Musculoskeletal: No muscular tenderness.  Cardiovascular:     Rate and Rhythm: Normal rate and regular rhythm.     Heart sounds: Normal heart sounds. No murmur.  Pulmonary:     Effort: Pulmonary effort is normal. No respiratory distress.     Breath sounds: Normal breath sounds. No wheezing or rhonchi.  Musculoskeletal:     Left knee: She exhibits decreased range of motion (Flexion 90 degrees, full extension.  ) and effusion (Trace to 1+.). She exhibits no ecchymosis and no erythema. Tenderness found. Medial joint line tenderness noted.  Lymphadenopathy:     Cervical: No cervical adenopathy.  Skin:    General: Skin is warm and dry.     Findings: No rash.  Neurological:     Mental Status: She is alert and oriented to person, place, and time.  Psychiatric:        Behavior: Behavior normal.    After successful lavage on left ear, TM visualized with minimal clear to slightly opaque.  The base but no significant retraction, erythema, or bulging.  Canal  otherwise appears normal     Assessment & Plan:    Beverly Kramer is a 64 y.o. female Otalgia of both ears Impacted cerumen of left ear - Plan: Ear wax removal  -Symptoms may be related to eustachian tube dysfunction versus cerumen that was removed.  Trial Flonase recommended, RTC precautions if symptoms persist/worsen  Pain, dental - Plan: amoxicillin (AMOXIL) 500 MG capsule  -Possible  abscess discussed and need for follow-up with dentist.  Temporarily started on amoxicillin but again stressed importance of dental follow-up with RTC/ER precautions  Left knee pain, unspecified chronicity - Plan: Ambulatory referral to Orthopedic Surgery  -Ongoing left knee pain with previous treatment as above.  New referral placed to orthopedics  Meds ordered this encounter  Medications  . amoxicillin (AMOXIL) 500 MG capsule    Sig: Take 1 capsule (500 mg total) by mouth 3 (three) times daily.    Dispense:  30 capsule    Refill:  0   Patient Instructions   I do not see an obvious ear infection at this time.  Try Flonase nasal spray 1 to 2 sprays at bedtime to see if that may help with some of the drainage feeling form the eustachian tube. More info below.  If the symptoms or not improving in the next week, return for recheck or we can refer you to ear nose and throat.  Please call your dentist right away to evaluate the swollen area on your right upper gum.  I did start amoxicillin antibiotic to cover for right now but you will need to be seen as soon as possible by your dentist.  I will refer you to orthopedics for your knee pain.  Return to the clinic or go to the nearest emergency room if any of your symptoms worsen or new symptoms occur.   Eustachian Tube Dysfunction  Eustachian tube dysfunction refers to a condition in which a blockage develops in the narrow passage that connects the middle ear to the back of the nose (eustachian tube). The eustachian tube regulates air pressure in the  middle ear by letting air move between the ear and nose. It also helps to drain fluid from the middle ear space. Eustachian tube dysfunction can affect one or both ears. When the eustachian tube does not function properly, air pressure, fluid, or both can build up in the middle ear. What are the causes? This condition occurs when the eustachian tube becomes blocked or cannot open normally. Common causes of this condition include:  Ear infections.  Colds and other infections that affect the nose, mouth, and throat (upper respiratory tract).  Allergies.  Irritation from cigarette smoke.  Irritation from stomach acid coming up into the esophagus (gastroesophageal reflux). The esophagus is the tube that carries food from the mouth to the stomach.  Sudden changes in air pressure, such as from descending in an airplane or scuba diving.  Abnormal growths in the nose or throat, such as: ? Growths that line the nose (nasal polyps). ? Abnormal growth of cells (tumors). ? Enlarged tissue at the back of the throat (adenoids). What increases the risk? You are more likely to develop this condition if:  You smoke.  You are overweight.  You are a child who has: ? Certain birth defects of the mouth, such as cleft palate. ? Large tonsils or adenoids. What are the signs or symptoms? Common symptoms of this condition include:  A feeling of fullness in the ear.  Ear pain.  Clicking or popping noises in the ear.  Ringing in the ear.  Hearing loss.  Loss of balance.  Dizziness. Symptoms may get worse when the air pressure around you changes, such as when you travel to an area of high elevation, fly on an airplane, or go scuba diving. How is this diagnosed? This condition may be diagnosed based on:  Your symptoms.  A physical exam of your ears, nose,  and throat.  Tests, such as those that measure: ? The movement of your eardrum (tympanogram). ? Your hearing (audiometry). How is this  treated? Treatment depends on the cause and severity of your condition.  In mild cases, you may relieve your symptoms by moving air into your ears. This is called "popping the ears."  In more severe cases, or if you have symptoms of fluid in your ears, treatment may include: ? Medicines to relieve congestion (decongestants). ? Medicines that treat allergies (antihistamines). ? Nasal sprays or ear drops that contain medicines that reduce swelling (steroids). ? A procedure to drain the fluid in your eardrum (myringotomy). In this procedure, a small tube is placed in the eardrum to:  Drain the fluid.  Restore the air in the middle ear space. ? A procedure to insert a balloon device through the nose to inflate the opening of the eustachian tube (balloon dilation). Follow these instructions at home: Lifestyle  Do not do any of the following until your health care provider approves: ? Travel to high altitudes. ? Fly in airplanes. ? Work in a Estate agent or room. ? Scuba dive.  Do not use any products that contain nicotine or tobacco, such as cigarettes and e-cigarettes. If you need help quitting, ask your health care provider.  Keep your ears dry. Wear fitted earplugs during showering and bathing. Dry your ears completely after. General instructions  Take over-the-counter and prescription medicines only as told by your health care provider.  Use techniques to help pop your ears as recommended by your health care provider. These may include: ? Chewing gum. ? Yawning. ? Frequent, forceful swallowing. ? Closing your mouth, holding your nose closed, and gently blowing as if you are trying to blow air out of your nose.  Keep all follow-up visits as told by your health care provider. This is important. Contact a health care provider if:  Your symptoms do not go away after treatment.  Your symptoms come back after treatment.  You are unable to pop your ears.  You have: ? A  fever. ? Pain in your ear. ? Pain in your head or neck. ? Fluid draining from your ear.  Your hearing suddenly changes.  You become very dizzy.  You lose your balance. Summary  Eustachian tube dysfunction refers to a condition in which a blockage develops in the eustachian tube.  It can be caused by ear infections, allergies, inhaled irritants, or abnormal growths in the nose or throat.  Symptoms include ear pain, hearing loss, or ringing in the ears.  Mild cases are treated with maneuvers to unblock the ears, such as yawning or ear popping.  Severe cases are treated with medicines. Surgery may also be done (rare). This information is not intended to replace advice given to you by your health care provider. Make sure you discuss any questions you have with your health care provider. Document Released: 04/05/2015 Document Revised: 06/29/2017 Document Reviewed: 06/29/2017 Elsevier Interactive Patient Education  [employer].    If you have lab work done today you will be contacted with your lab results within the next 2 weeks.  If you have not heard from [place] then please contact us. The fastest way to get your results is to register for My Chart.   IF you received an x-ray today, you will receive an invoice from [facility]. Please contact [facility] at [phone] with questions or concerns regarding your invoice.   IF you received labwork today, you  will receive an invoice from [facility]. Please contact [facility] at [phone] with questions or concerns regarding your invoice.   Our billing staff will not be able to assist you with questions regarding bills from these companies.  You will be contacted with the lab results as soon as they are available. The fastest way to get your results is to activate your My Chart account. Instructions are located on the last page of this paperwork. If you have not heard from [place] regarding the results in 2 weeks, please  contact this office.       Signed,   Meredith Staggers, MD [facility].  04/26/18 6:30 PM

## 2018-04-22 NOTE — Patient Instructions (Addendum)
I do not see an obvious ear infection at this time.  Try Flonase nasal spray 1 to 2 sprays at bedtime to see if that may help with some of the drainage feeling form the eustachian tube. More info below.  If the symptoms or not improving in the next week, return for recheck or we can refer you to ear nose and throat.  Please call your dentist right away to evaluate the swollen area on your right upper gum.  I did start amoxicillin antibiotic to cover for right now but you will need to be seen as soon as possible by your dentist.  I will refer you to orthopedics for your knee pain.  Return to the clinic or go to the nearest emergency room if any of your symptoms worsen or new symptoms occur.   Eustachian Tube Dysfunction  Eustachian tube dysfunction refers to a condition in which a blockage develops in the narrow passage that connects the middle ear to the back of the nose (eustachian tube). The eustachian tube regulates air pressure in the middle ear by letting air move between the ear and nose. It also helps to drain fluid from the middle ear space. Eustachian tube dysfunction can affect one or both ears. When the eustachian tube does not function properly, air pressure, fluid, or both can build up in the middle ear. What are the causes? This condition occurs when the eustachian tube becomes blocked or cannot open normally. Common causes of this condition include:  Ear infections.  Colds and other infections that affect the nose, mouth, and throat (upper respiratory tract).  Allergies.  Irritation from cigarette smoke.  Irritation from stomach acid coming up into the esophagus (gastroesophageal reflux). The esophagus is the tube that carries food from the mouth to the stomach.  Sudden changes in air pressure, such as from descending in an airplane or scuba diving.  Abnormal growths in the nose or throat, such as: ? Growths that line the nose (nasal polyps). ? Abnormal growth of cells  (tumors). ? Enlarged tissue at the back of the throat (adenoids). What increases the risk? You are more likely to develop this condition if:  You smoke.  You are overweight.  You are a child who has: ? Certain birth defects of the mouth, such as cleft palate. ? Large tonsils or adenoids. What are the signs or symptoms? Common symptoms of this condition include:  A feeling of fullness in the ear.  Ear pain.  Clicking or popping noises in the ear.  Ringing in the ear.  Hearing loss.  Loss of balance.  Dizziness. Symptoms may get worse when the air pressure around you changes, such as when you travel to an area of high elevation, fly on an airplane, or go scuba diving. How is this diagnosed? This condition may be diagnosed based on:  Your symptoms.  A physical exam of your ears, nose, and throat.  Tests, such as those that measure: ? The movement of your eardrum (tympanogram). ? Your hearing (audiometry). How is this treated? Treatment depends on the cause and severity of your condition.  In mild cases, you may relieve your symptoms by moving air into your ears. This is called "popping the ears."  In more severe cases, or if you have symptoms of fluid in your ears, treatment may include: ? Medicines to relieve congestion (decongestants). ? Medicines that treat allergies (antihistamines). ? Nasal sprays or ear drops that contain medicines that reduce swelling (steroids). ? A procedure  to drain the fluid in your eardrum (myringotomy). In this procedure, a small tube is placed in the eardrum to:  Drain the fluid.  Restore the air in the middle ear space. ? A procedure to insert a balloon device through the nose to inflate the opening of the eustachian tube (balloon dilation). Follow these instructions at home: Lifestyle  Do not do any of the following until your health care provider approves: ? Travel to high altitudes. ? Fly in airplanes. ? Work in a Furniture conservator/restorerpressurized  cabin or room. ? Scuba dive.  Do not use any products that contain nicotine or tobacco, such as cigarettes and e-cigarettes. If you need help quitting, ask your health care provider.  Keep your ears dry. Wear fitted earplugs during showering and bathing. Dry your ears completely after. General instructions  Take over-the-counter and prescription medicines only as told by your health care provider.  Use techniques to help pop your ears as recommended by your health care provider. These may include: ? Chewing gum. ? Yawning. ? Frequent, forceful swallowing. ? Closing your mouth, holding your nose closed, and gently blowing as if you are trying to blow air out of your nose.  Keep all follow-up visits as told by your health care provider. This is important. Contact a health care provider if:  Your symptoms do not go away after treatment.  Your symptoms come back after treatment.  You are unable to pop your ears.  You have: ? A fever. ? Pain in your ear. ? Pain in your head or neck. ? Fluid draining from your ear.  Your hearing suddenly changes.  You become very dizzy.  You lose your balance. Summary  Eustachian tube dysfunction refers to a condition in which a blockage develops in the eustachian tube.  It can be caused by ear infections, allergies, inhaled irritants, or abnormal growths in the nose or throat.  Symptoms include ear pain, hearing loss, or ringing in the ears.  Mild cases are treated with maneuvers to unblock the ears, such as yawning or ear popping.  Severe cases are treated with medicines. Surgery may also be done (rare). This information is not intended to replace advice given to you by your health care provider. Make sure you discuss any questions you have with your health care provider. Document Released: 04/05/2015 Document Revised: 06/29/2017 Document Reviewed: 06/29/2017 Elsevier Interactive Patient Education  [employer]2019 [employer].    If you have  lab work done today you will be contacted with your lab results within the next 2 weeks.  If you have not heard from us then please contact us. The fastest way to get your results is to register for My Chart.   IF you received an x-ray today, you will receive an invoice from [facility]. Please contact [facility] at [phone] with questions or concerns regarding your invoice.   IF you received labwork today, you will receive an invoice from [facility]. Please contact [facility] at [phone] with questions or concerns regarding your invoice.   Our billing staff will not be able to assist you with questions regarding bills from these companies.  You will be contacted with the lab results as soon as they are available. The fastest way to get your results is to activate your My Chart account. Instructions are located on the last page of this paperwork. If you have not heard from us regarding the results in 2 weeks, please contact this office.

## 2018-04-27 ENCOUNTER — Ambulatory Visit (INDEPENDENT_AMBULATORY_CARE_PROVIDER_SITE_OTHER): Payer: BLUE CROSS/BLUE SHIELD | Admitting: Physician Assistant

## 2018-04-27 ENCOUNTER — Encounter (INDEPENDENT_AMBULATORY_CARE_PROVIDER_SITE_OTHER): Payer: Self-pay | Admitting: Orthopaedic Surgery

## 2018-04-27 ENCOUNTER — Ambulatory Visit (INDEPENDENT_AMBULATORY_CARE_PROVIDER_SITE_OTHER): Payer: BLUE CROSS/BLUE SHIELD

## 2018-04-27 ENCOUNTER — Other Ambulatory Visit (INDEPENDENT_AMBULATORY_CARE_PROVIDER_SITE_OTHER): Payer: Self-pay

## 2018-04-27 DIAGNOSIS — G8929 Other chronic pain: Secondary | ICD-10-CM

## 2018-04-27 DIAGNOSIS — M25562 Pain in left knee: Secondary | ICD-10-CM

## 2018-04-27 NOTE — Progress Notes (Signed)
Office Visit Note   Patient: Beverly Kramer           Date of Birth: Jun 18, 1955           MRN: [id number] Visit Date: 04/27/2018              Requested by: Shade Flood, MD [address] [place], [place] [address] PCP: Sherren Mocha, MD   Assessment & Plan: Visit Diagnoses:  1. Left knee pain, unspecified chronicity     Plan: Due to her mechanical symptoms and clinical exam today recommend MRI to rule out left knee meniscal tear.  Follow-Up Instructions: Return for After MRI.   Orders:  Orders Placed This Encounter  Procedures  . XR Knee 1-2 Views Left   No orders of the defined types were placed in this encounter.     Procedures: No procedures performed   Clinical Data: No additional findings.   Subjective: Chief Complaint  Patient presents with  . Left Knee - Pain    HPI Beverly Kramer is a 63 year old female comes in today with left knee pain for approximately 3 months.  No known injury.  She does report a years ago she had an injection in her knee that got better but she is not interested in any type of injection.  She is been seen over at [facility] but due to insurance changes she has now came to the hospital for treatment of her knee.  She has been to physical therapy visits through [facility] and tried an over-the-counter brace.  She is also tried Aleve and Mobic without any real relief.  She had one incident where the knee lock on her since that time she has had some giving way occasionally but no painful popping or additional catching or locking.  She notes the knee does swell at times.  Pain is worse at night.  She has pain with going up and down steps.  She does not want any type of cortisone injection stating that they have not worked for her in the past.  She is currently had no injection in the knee in years. She is currently on amoxicillin due to tooth infection. Review of Systems Please see HPI otherwise negative or  noncontributory.  Objective: Vital Signs: LMP 07/07/2014   Physical Exam Constitutional:      Appearance: She is not ill-appearing or diaphoretic.  Pulmonary:     Effort: Pulmonary effort is normal.  Neurological:     Mental Status: She is alert and oriented to person, place, and time.  Psychiatric:        Mood and Affect: Mood normal.     Ortho Exam Bilateral knees good range of motion mild patellofemoral crepitus bilateral knees.  No instability valgus varus stressing of either knee anterior drawer is negative bilaterally.  McMurray's positive, left negative on the right.  Tenderness along medial joint line the left knee.  Otherwise bilateral knees nontender.  No abnormal warmth erythema or ecchymosis either knee.  Positive effusion in the left knee.  Right knee no effusion. Specialty Comments:  No specialty comments available.  Imaging: Xr Knee 1-2 Views Left  Result Date: 04/27/2018 Left knee 3 views: Medial lateral joint lines well-preserved.  Mild patellofemoral changes.  No acute fractures knee is well located.    PMFS History: Patient Active Problem List   Diagnosis Date Noted  . DYSLIPIDEMIA 01/25/2007  . ANEMIA-NOS 01/25/2007  . HYPERTENSION 01/25/2007   Past Medical History:  Diagnosis Date  .  Allergy   . Hypertension     Family History  Problem Relation Age of Onset  . Heart disease Father        died from heart attack (MI)  . Breast cancer Maternal Grandmother     Past Surgical History:  Procedure Laterality Date  . CHOLECYSTECTOMY     per pt 10+ years ago   Social History   Occupational History  . Not on file  Tobacco Use  . Smoking status: Never Smoker  . Smokeless tobacco: Never Used  Substance and Sexual Activity  . Alcohol use: No  . Drug use: No  . Sexual activity: Not on file

## 2018-05-06 ENCOUNTER — Ambulatory Visit
Admission: RE | Admit: 2018-05-06 | Discharge: 2018-05-06 | Disposition: A | Discharge status: Home / Self Care | Payer: BLUE CROSS/BLUE SHIELD | Source: Ambulatory Visit | Attending: Orthopaedic Surgery | Admitting: Orthopaedic Surgery

## 2018-05-06 DIAGNOSIS — M25562 Pain in left knee: Principal | ICD-10-CM

## 2018-05-06 DIAGNOSIS — G8929 Other chronic pain: Secondary | ICD-10-CM

## 2018-05-09 ENCOUNTER — Ambulatory Visit (INDEPENDENT_AMBULATORY_CARE_PROVIDER_SITE_OTHER): Payer: BLUE CROSS/BLUE SHIELD | Admitting: Physician Assistant

## 2018-05-09 ENCOUNTER — Encounter (INDEPENDENT_AMBULATORY_CARE_PROVIDER_SITE_OTHER): Payer: Self-pay | Admitting: Physician Assistant

## 2018-05-09 DIAGNOSIS — S83242D Other tear of medial meniscus, current injury, left knee, subsequent encounter: Secondary | ICD-10-CM | POA: Diagnosis not present

## 2018-05-09 NOTE — Progress Notes (Signed)
   Office Visit Note   Patient: Beverly Kramer           Date of Birth: [birth year]           MRN: [id number] Visit Date: 05/09/2018              Requested by: Sherren Mocha, MD [address] [place], [place] [address] PCP: Sherren Mocha, MD   Assessment & Plan: Visit Diagnoses:  1. Acute medial meniscus tear of left knee, subsequent encounter     Plan: Due to the fact the patient continues to have giving way of the left knee along with effusion and continued pain recommend left knee arthroscopy with arthroscopic debridement and partial medial meniscectomy.  She did like to proceed with this in the near future.  Questions were encouraged and answered at length.  Risk including but not limited to infection, DVT/PE and worsening pain discussed with patient at length.  She will follow-up with [place] 1 week postop.  Follow-Up Instructions: Return for 1 week post op.   Orders:  No orders of the defined types were placed in this encounter.  No orders of the defined types were placed in this encounter.     Procedures: No procedures performed   Clinical Data: No additional findings.   Subjective: Chief Complaint  Patient presents with  . Left Knee - Follow-up    HPI Patient returns today to go over the MRI of her left knee.  She continues to have giving way sensation in the pain in the knee.  Pain is been present for at least the past 3 months.  No known injury. MRI images along with knee model are use to demonstrate the results of the MRI today.  Right knee MRI dated 05/06/2018 showed a root tear of the posterior root medial meniscus with meniscal extrusion from the joint.  Moderate sized effusion.  Intact ligaments throughout.  Mild medial compartmental degenerative changes.  Review of Systems   Objective: Vital Signs: LMP 07/07/2014   Physical Exam Constitutional:      Appearance: She is not ill-appearing or diaphoretic.  Pulmonary:     Effort: Pulmonary effort is normal.    Neurological:     Mental Status: She is alert and oriented to person, place, and time.     Ortho Exam Left knee positive effusion.  Tenderness along medial joint line.  Otherwise good range of motion of the knee.  No abnormal warmth erythema Specialty Comments:  No specialty comments available.  Imaging: No results found.   PMFS History: Patient Active Problem List   Diagnosis Date Noted  . DYSLIPIDEMIA 01/25/2007  . ANEMIA-NOS 01/25/2007  . HYPERTENSION 01/25/2007   Past Medical History:  Diagnosis Date  . Allergy   . Hypertension     Family History  Problem Relation Age of Onset  . Heart disease Father        died from heart attack (MI)  . Breast cancer Maternal Grandmother     Past Surgical History:  Procedure Laterality Date  . CHOLECYSTECTOMY     per pt 10+ years ago   Social History   Occupational History  . Not on file  Tobacco Use  . Smoking status: Never Smoker  . Smokeless tobacco: Never Used  Substance and Sexual Activity  . Alcohol use: No  . Drug use: No  . Sexual activity: Not on file

## 2018-05-18 ENCOUNTER — Telehealth (INDEPENDENT_AMBULATORY_CARE_PROVIDER_SITE_OTHER): Payer: Self-pay

## 2018-05-18 NOTE — Telephone Encounter (Signed)
Per Aleda Grana, patient stopped by to get records to go to another physician.  [employer] has changed her coverage in which we are out of network.  She will have to go to a [facility] network provider to get maximum in network benefits.

## 2018-06-06 ENCOUNTER — Telehealth (INDEPENDENT_AMBULATORY_CARE_PROVIDER_SITE_OTHER): Payer: Self-pay | Admitting: Radiology

## 2018-06-06 NOTE — Telephone Encounter (Signed)
Patient walked in today requesting CD of left knee images. CD was made and given to patient.  Informed patient if MRI of left knee was needed then she needed to request that from [facility].

## 2018-09-19 ENCOUNTER — Other Ambulatory Visit: Payer: Self-pay | Admitting: Family Medicine

## 2018-09-19 DIAGNOSIS — Z1231 Encounter for screening mammogram for malignant neoplasm of breast: Secondary | ICD-10-CM

## 2019-07-21 ENCOUNTER — Other Ambulatory Visit (HOSPITAL_COMMUNITY)
Admission: RE | Admit: 2019-07-21 | Discharge: 2019-07-21 | Disposition: A | Discharge status: Home / Self Care | Payer: 59 | Source: Ambulatory Visit | Attending: Family Medicine | Admitting: Family Medicine

## 2019-07-21 ENCOUNTER — Other Ambulatory Visit: Payer: Self-pay | Admitting: Family Medicine

## 2019-07-21 DIAGNOSIS — Z01411 Encounter for gynecological examination (general) (routine) with abnormal findings: Secondary | ICD-10-CM | POA: Insufficient documentation

## 2019-07-25 LAB — CYTOLOGY - PAP: Diagnosis: NEGATIVE

## 2020-03-27 DIAGNOSIS — M79671 Pain in right foot: Secondary | ICD-10-CM | POA: Insufficient documentation

## 2020-08-05 ENCOUNTER — Other Ambulatory Visit: Payer: Self-pay | Admitting: Family Medicine

## 2020-08-05 DIAGNOSIS — M79622 Pain in left upper arm: Secondary | ICD-10-CM

## 2020-09-03 ENCOUNTER — Ambulatory Visit
Admission: RE | Admit: 2020-09-03 | Discharge: 2020-09-03 | Disposition: A | Discharge status: Home / Self Care | Payer: 59 | Source: Ambulatory Visit | Attending: Family Medicine | Admitting: Family Medicine

## 2020-09-03 ENCOUNTER — Other Ambulatory Visit: Payer: Self-pay | Admitting: Family Medicine

## 2020-09-03 ENCOUNTER — Other Ambulatory Visit: Payer: Self-pay

## 2020-09-03 ENCOUNTER — Ambulatory Visit: Payer: 59

## 2020-09-03 DIAGNOSIS — Z1231 Encounter for screening mammogram for malignant neoplasm of breast: Secondary | ICD-10-CM

## 2020-09-03 DIAGNOSIS — M79622 Pain in left upper arm: Secondary | ICD-10-CM

## 2020-11-15 ENCOUNTER — Other Ambulatory Visit: Payer: Self-pay | Admitting: Family Medicine

## 2020-11-15 ENCOUNTER — Ambulatory Visit
Admission: RE | Admit: 2020-11-15 | Discharge: 2020-11-15 | Disposition: A | Discharge status: Home / Self Care | Payer: 59 | Source: Ambulatory Visit | Attending: Family Medicine | Admitting: Family Medicine

## 2020-11-15 DIAGNOSIS — S0990XA Unspecified injury of head, initial encounter: Secondary | ICD-10-CM

## 2021-04-08 ENCOUNTER — Other Ambulatory Visit: Payer: Self-pay | Admitting: Family Medicine

## 2021-04-08 DIAGNOSIS — E2839 Other primary ovarian failure: Secondary | ICD-10-CM

## 2021-07-21 ENCOUNTER — Other Ambulatory Visit: Payer: Self-pay | Admitting: Family Medicine

## 2021-07-21 DIAGNOSIS — Z1231 Encounter for screening mammogram for malignant neoplasm of breast: Secondary | ICD-10-CM

## 2021-09-02 DIAGNOSIS — Z0000 Encounter for general adult medical examination without abnormal findings: Secondary | ICD-10-CM | POA: Diagnosis not present

## 2021-09-02 DIAGNOSIS — E559 Vitamin D deficiency, unspecified: Secondary | ICD-10-CM | POA: Diagnosis not present

## 2021-09-02 DIAGNOSIS — E1142 Type 2 diabetes mellitus with diabetic polyneuropathy: Secondary | ICD-10-CM | POA: Diagnosis not present

## 2021-09-02 DIAGNOSIS — Z23 Encounter for immunization: Secondary | ICD-10-CM | POA: Diagnosis not present

## 2021-09-02 DIAGNOSIS — I10 Essential (primary) hypertension: Secondary | ICD-10-CM | POA: Diagnosis not present

## 2021-09-02 DIAGNOSIS — E7800 Pure hypercholesterolemia, unspecified: Secondary | ICD-10-CM | POA: Diagnosis not present

## 2021-09-02 DIAGNOSIS — J309 Allergic rhinitis, unspecified: Secondary | ICD-10-CM | POA: Diagnosis not present

## 2021-09-02 DIAGNOSIS — Z79899 Other long term (current) drug therapy: Secondary | ICD-10-CM | POA: Diagnosis not present

## 2021-09-02 DIAGNOSIS — R7611 Nonspecific reaction to tuberculin skin test without active tuberculosis: Secondary | ICD-10-CM | POA: Diagnosis not present

## 2021-09-05 ENCOUNTER — Ambulatory Visit
Admission: RE | Admit: 2021-09-05 | Discharge: 2021-09-05 | Disposition: A | Discharge status: Home / Self Care | Payer: Medicare HMO | Source: Ambulatory Visit | Attending: Family Medicine | Admitting: Family Medicine

## 2021-09-05 DIAGNOSIS — Z1231 Encounter for screening mammogram for malignant neoplasm of breast: Secondary | ICD-10-CM | POA: Diagnosis not present

## 2021-09-08 ENCOUNTER — Other Ambulatory Visit: Payer: Self-pay | Admitting: Family Medicine

## 2021-09-08 ENCOUNTER — Ambulatory Visit: Payer: Self-pay

## 2021-09-08 ENCOUNTER — Ambulatory Visit
Admission: RE | Admit: 2021-09-08 | Discharge: 2021-09-08 | Disposition: A | Discharge status: Home / Self Care | Payer: Medicare HMO | Source: Ambulatory Visit | Attending: Family Medicine | Admitting: Family Medicine

## 2021-09-08 DIAGNOSIS — Z021 Encounter for pre-employment examination: Secondary | ICD-10-CM

## 2021-09-08 DIAGNOSIS — Z780 Asymptomatic menopausal state: Secondary | ICD-10-CM | POA: Diagnosis not present

## 2021-09-08 DIAGNOSIS — M47814 Spondylosis without myelopathy or radiculopathy, thoracic region: Secondary | ICD-10-CM | POA: Diagnosis not present

## 2021-09-08 DIAGNOSIS — E2839 Other primary ovarian failure: Secondary | ICD-10-CM

## 2021-09-11 DIAGNOSIS — M1990 Unspecified osteoarthritis, unspecified site: Secondary | ICD-10-CM | POA: Diagnosis not present

## 2022-04-27 DIAGNOSIS — E119 Type 2 diabetes mellitus without complications: Secondary | ICD-10-CM | POA: Diagnosis not present

## 2022-04-27 DIAGNOSIS — H2513 Age-related nuclear cataract, bilateral: Secondary | ICD-10-CM | POA: Diagnosis not present

## 2022-04-27 DIAGNOSIS — I10 Essential (primary) hypertension: Secondary | ICD-10-CM | POA: Diagnosis not present

## 2022-04-28 DIAGNOSIS — Z23 Encounter for immunization: Secondary | ICD-10-CM | POA: Diagnosis not present

## 2022-05-01 DIAGNOSIS — J069 Acute upper respiratory infection, unspecified: Secondary | ICD-10-CM | POA: Diagnosis not present

## 2022-05-01 DIAGNOSIS — B356 Tinea cruris: Secondary | ICD-10-CM | POA: Diagnosis not present

## 2022-05-11 DIAGNOSIS — H2513 Age-related nuclear cataract, bilateral: Secondary | ICD-10-CM | POA: Diagnosis not present

## 2022-05-11 DIAGNOSIS — H2511 Age-related nuclear cataract, right eye: Secondary | ICD-10-CM | POA: Diagnosis not present

## 2022-05-11 DIAGNOSIS — Z794 Long term (current) use of insulin: Secondary | ICD-10-CM | POA: Diagnosis not present

## 2022-05-11 DIAGNOSIS — I10 Essential (primary) hypertension: Secondary | ICD-10-CM | POA: Diagnosis not present

## 2022-05-11 DIAGNOSIS — E119 Type 2 diabetes mellitus without complications: Secondary | ICD-10-CM | POA: Diagnosis not present

## 2022-05-18 DIAGNOSIS — Z9181 History of falling: Secondary | ICD-10-CM | POA: Diagnosis not present

## 2022-05-18 DIAGNOSIS — R52 Pain, unspecified: Secondary | ICD-10-CM | POA: Diagnosis not present

## 2022-06-29 DIAGNOSIS — M6248 Contracture of muscle, other site: Secondary | ICD-10-CM | POA: Diagnosis not present

## 2022-06-29 DIAGNOSIS — S60229A Contusion of unspecified hand, initial encounter: Secondary | ICD-10-CM | POA: Diagnosis not present

## 2022-07-01 ENCOUNTER — Ambulatory Visit
Admission: RE | Admit: 2022-07-01 | Discharge: 2022-07-01 | Disposition: A | Discharge status: Home / Self Care | Payer: Medicare HMO | Source: Ambulatory Visit | Attending: Family Medicine | Admitting: Family Medicine

## 2022-07-01 ENCOUNTER — Other Ambulatory Visit: Payer: Self-pay | Admitting: Family Medicine

## 2022-07-01 DIAGNOSIS — M79642 Pain in left hand: Secondary | ICD-10-CM

## 2022-07-01 DIAGNOSIS — M19042 Primary osteoarthritis, left hand: Secondary | ICD-10-CM | POA: Diagnosis not present

## 2022-07-09 DIAGNOSIS — H2511 Age-related nuclear cataract, right eye: Secondary | ICD-10-CM | POA: Diagnosis not present

## 2022-07-10 DIAGNOSIS — H25012 Cortical age-related cataract, left eye: Secondary | ICD-10-CM | POA: Diagnosis not present

## 2022-07-10 DIAGNOSIS — Z961 Presence of intraocular lens: Secondary | ICD-10-CM | POA: Diagnosis not present

## 2022-07-10 DIAGNOSIS — H25042 Posterior subcapsular polar age-related cataract, left eye: Secondary | ICD-10-CM | POA: Diagnosis not present

## 2022-07-10 DIAGNOSIS — H2512 Age-related nuclear cataract, left eye: Secondary | ICD-10-CM | POA: Diagnosis not present

## 2022-07-14 ENCOUNTER — Ambulatory Visit: Payer: Medicare HMO | Admitting: Neurology

## 2022-07-16 DIAGNOSIS — G629 Polyneuropathy, unspecified: Secondary | ICD-10-CM | POA: Insufficient documentation

## 2022-07-20 ENCOUNTER — Ambulatory Visit: Payer: Medicare HMO | Admitting: Neurology

## 2022-07-20 ENCOUNTER — Encounter: Payer: Self-pay | Admitting: Neurology

## 2022-07-20 DIAGNOSIS — G629 Polyneuropathy, unspecified: Secondary | ICD-10-CM

## 2022-07-30 DIAGNOSIS — H2512 Age-related nuclear cataract, left eye: Secondary | ICD-10-CM | POA: Diagnosis not present

## 2022-08-11 ENCOUNTER — Other Ambulatory Visit: Payer: Self-pay | Admitting: Family Medicine

## 2022-08-11 DIAGNOSIS — Z1231 Encounter for screening mammogram for malignant neoplasm of breast: Secondary | ICD-10-CM

## 2022-08-25 ENCOUNTER — Ambulatory Visit: Payer: Medicare HMO | Admitting: Neurology

## 2022-08-25 ENCOUNTER — Encounter: Payer: Self-pay | Admitting: Neurology

## 2022-08-25 ENCOUNTER — Telehealth: Payer: Self-pay | Admitting: Neurology

## 2022-08-25 VITALS — BP 133/84 | HR 69 | Ht 62.0 in | Wt 154.5 lb

## 2022-08-25 DIAGNOSIS — W19XXXA Unspecified fall, initial encounter: Secondary | ICD-10-CM

## 2022-08-25 DIAGNOSIS — R252 Cramp and spasm: Secondary | ICD-10-CM | POA: Diagnosis not present

## 2022-08-25 DIAGNOSIS — G44309 Post-traumatic headache, unspecified, not intractable: Secondary | ICD-10-CM

## 2022-08-25 DIAGNOSIS — R519 Headache, unspecified: Secondary | ICD-10-CM

## 2022-08-25 DIAGNOSIS — R208 Other disturbances of skin sensation: Secondary | ICD-10-CM

## 2022-08-25 DIAGNOSIS — R292 Abnormal reflex: Secondary | ICD-10-CM

## 2022-08-25 MED ORDER — GABAPENTIN 300 MG PO CAPS: 300.0000 mg | ORAL_CAPSULE | Freq: Three times a day (TID) | ORAL | 5 refills | Status: AC

## 2022-08-25 NOTE — Telephone Encounter (Signed)
Francine Graven Berkley Harvey: [id number] exp. 08/25/22-09/24/22 sent to GI [phone]

## 2022-08-25 NOTE — Progress Notes (Signed)
[facility]  PATIENT: Beverly Kramer DOB: 1955/05/28  REFERRING DOCTOR OR PCP: Mila Palmer, MD SOURCE: Patient, notes from primary care, imaging results, x-rays personally reviewed  _________________________________   HISTORICAL  CHIEF COMPLAINT:  Chief Complaint  Patient presents with   New Patient (Initial Visit)    Pt in room 10. New paper referral from [facility]. Pt shooting pain on left arm & leg on and off pt reports she fell walking to work and hit head in March. Pt said she took tylenol didn't get better scheduled visit with PCP. Pt said left side of head hurts as well, not an headache. Pt said both feet will cramp at times.     HISTORY OF PRESENT ILLNESS:  The patient, Beverly Kramer, at [facility] for a neurologic consultation regarding her left arm and leg pain.  She is a 67 year old woman who had the nset of pain in her limbs that would come and go.  She noted cramps in her feet and pain shooting down legs.    The cramps were painful.   She fell in March 2024 while walking to work and hit her head.   She continues to report some headache.      Since the fall, she has had a lot of pain in her left hand.   Xray showed no fracture but she had arthritic pain in the distal IP joints digit 2-5.    Since the fall, she has also had more cramping pain in her feet.    Current pain shoots down the left arm and both legs.      She has not had further falls since the one in March 2024.  Bladder function is fine.  She uses the bannister on stairs but feels balance is baseline.       REVIEW OF SYSTEMS: Constitutional: No fevers, chills, sweats, or change in appetite Eyes: No visual changes, double vision, eye pain Ear, nose and throat: No hearing loss, ear pain, nasal congestion, sore throat Cardiovascular: No chest pain, palpitations Respiratory:  No shortness of breath at rest or with exertion.   No wheezes GastrointestinaI: No  nausea, vomiting, diarrhea, abdominal pain, fecal incontinence Genitourinary:  No dysuria, urinary retention or frequency.  No nocturia. Musculoskeletal:  No neck pain, back pain Integumentary: No rash, pruritus, skin lesions Neurological: as above Psychiatric: No depression at this time.  No anxiety Endocrine: No palpitations, diaphoresis, change in appetite, change in weigh or increased thirst Hematologic/Lymphatic:  No anemia, purpura, petechiae. Allergic/Immunologic: No itchy/runny eyes, nasal congestion, recent allergic reactions, rashes  ALLERGIES: No Known Allergies  HOME MEDICATIONS:  Current Outpatient Medications:    amLODipine (NORVASC) 5 MG tablet, Take 5 mg by mouth daily., Disp: , Rfl:    hydrochlorothiazide (HYDRODIURIL) 25 MG tablet, TAKE 1 TABLET BY MOUTH ONCE DAILY FOR 90 DAYS, Disp: , Rfl:    amoxicillin (AMOXIL) 500 MG capsule, Take 1 capsule (500 mg total) by mouth 3 (three) times daily. (Patient not taking: Reported on 08/25/2022), Disp: 30 capsule, Rfl: 0   Cinnamon 500 MG capsule, Cinnamon (Patient not taking: Reported on 08/25/2022), Disp: , Rfl:    glucose blood test strip, 1 each. Once per day, Disp: , Rfl:    losartan (COZAAR) 100 MG tablet, TAKE 1 TABLET BY MOUTH ONCE DAILY FOR 90 DAYS, Disp: , Rfl:    losartan-hydrochlorothiazide (HYZAAR) 100-25 MG tablet, , Disp: , Rfl: 1   NONFORMULARY OR COMPOUNDED ITEM, [facility]  Achilles Tendonitis  Cream- Diclofenac 3%, Baclofen 2%, Bupivacaine 1%, Doxepin 5%, Gabapentin 6%, Ibuprofen 3%, Pentoxifylline 3% Apply 1-2 grams to affected area 3-4 times daily Qty. 120 gm 3 refills, Disp: 1 each, Rfl: 0   simvastatin (ZOCOR) 40 MG tablet, simvastatin 40 mg tablet, Disp: , Rfl:   PAST MEDICAL HISTORY: Past Medical History:  Diagnosis Date   Allergy    Anemia    Arthralgia    Familial benign neutropenia (HCC)    GAD (generalized anxiety disorder)    Hypercholesteremia    Hypertension    Low vitamin B12 level     Myalgia    Prediabetes     PAST SURGICAL HISTORY: Past Surgical History:  Procedure Laterality Date   CHOLECYSTECTOMY     per pt 10+ years ago    FAMILY HISTORY: Family History  Problem Relation Age of Onset   Heart disease Father        died from heart attack (MI)   Breast cancer Maternal Grandmother     SOCIAL HISTORY: Social History   Socioeconomic History   Marital status: Divorced    Spouse name: Not on file   Number of children: Not on file   Years of education: Not on file   Highest education level: Not on file  Occupational History   Not on file  Tobacco Use   Smoking status: Never   Smokeless tobacco: Never  Vaping Use   Vaping Use: Never used  Substance and Sexual Activity   Alcohol use: No   Drug use: No   Sexual activity: Not on file  Other Topics Concern   Not on file  Social History Narrative   Right handed   Wears prescription ( has not bought the glasses yet)    Social Determinants of Health   Financial Resource Strain: Not on file  Food Insecurity: Not on file  Transportation Needs: Not on file  Physical Activity: Not on file  Stress: Not on file  Social Connections: Not on file  Intimate Partner Violence: Not on file       PHYSICAL EXAM  Vitals:   08/25/22 1022  BP: 133/84  Pulse: 69  Weight: 154 lb 8 oz (70.1 kg)  Height: 5\' 2"  (1.575 m)    Body mass index is 28.26 kg/m.   General: The patient is well-developed and well-nourished and in no acute distress  HEENT:  Head is [place]/AT.  Sclera are anicteric.  Funduscopic exam shows normal optic discs and retinal vessels.  Neck: No carotid bruits are noted.  The neck is nontender.  Cardiovascular: The heart has a regular rate and rhythm with a normal S1 and S2. There were no murmurs, gallops or rubs.    Skin: Extremities are without rash or  edema.  Musculoskeletal:  Back is nontender  Neurologic Exam  Mental status: The patient is alert and oriented x 3 at the time of  the examination. The patient has apparent normal recent and remote memory, with an apparently normal attention span and concentration ability.   Speech is normal.  Cranial nerves: Extraocular movements are full. Pupils are equal, round, and reactive to light and accomodation.  Visual fields are full.  Facial symmetry is present. There is good facial sensation to soft touch bilaterally.Facial strength is normal.  Trapezius and sternocleidomastoid strength is normal. No dysarthria is noted.  The tongue is midline, and the patient has symmetric elevation of the soft palate. No obvious hearing deficits are noted.  Motor:  Muscle bulk is  normal.   Tone is normal. Strength is  5 / 5 in all 4 extremities.   Sensory: Sensory testing is intact to pinprick, soft touch and vibration sensation in all 4 extremities.  Coordination: Cerebellar testing reveals good finger-nose-finger and heel-to-shin bilaterally.  Gait and station: Station is normal.   Gait is normal. Tandem gait is normal. Romberg is negative.   Reflexes: Deep tendon reflexes are 3 in arms, 3+ at knees (spread left > right) and 2 at ankles.  No ankle clonus.   Plantar responses are flexor.    DIAGNOSTIC DATA (LABS, IMAGING, TESTING) - I reviewed patient records, labs, notes, testing and imaging myself where available.  Lab Results  Component Value Date   WBC 3.5 (L) 04/01/2012   HGB 11.9 (L) 04/01/2012   HCT 36.3 04/01/2012   MCV 76.7 (L) 04/01/2012   PLT 250 04/01/2012      Component Value Date/Time   NA 138 04/01/2012 1212   K 3.8 04/01/2012 1212   CL 103 04/01/2012 1212   CO2 27 04/01/2012 1212   GLUCOSE 73 04/01/2012 1212   BUN 14 04/01/2012 1212   CREATININE 0.71 04/01/2012 1212   CALCIUM 9.7 04/01/2012 1212   PROT 7.2 04/01/2012 1212   ALBUMIN 4.3 04/01/2012 1212   AST 25 04/01/2012 1212   ALT 23 04/01/2012 1212   ALKPHOS 33 (L) 04/01/2012 1212   BILITOT 0.5 04/01/2012 1212   GFRNONAA >60 10/14/2007 1410   GFRAA   10/14/2007 1410    >60        The eGFR has been calculated using the MDRD equation. This calculation has not been validated in all clinical   Lab Results  Component Value Date   CHOL 165 04/01/2012   HDL 56 04/01/2012   LDLCALC 102 (H) 04/01/2012   TRIG 36 04/01/2012   CHOLHDL 2.9 04/01/2012   Lab Results  Component Value Date   HGBA1C 6.1 01/31/2009   No results found for: "VITAMINB12" Lab Results  Component Value Date   TSH 1.389 05/27/2011       ASSESSMENT AND PLAN  Post-traumatic headache, not intractable, unspecified chronicity pattern - Plan: MR BRAIN WO CONTRAST  New onset of headaches after age 26 - Plan: MR BRAIN WO CONTRAST  Dysesthesia - Plan: MR CERVICAL SPINE WO CONTRAST  Cramp of toe  Hyperreflexia - Plan: MR CERVICAL SPINE WO CONTRAST, MR BRAIN WO CONTRAST  Fall, initial encounter - Plan: MR CERVICAL SPINE WO CONTRAST    In summary, Ms. Sturm is a 67 year old woman who has noted intermittent cramping and sensory changes in her legs that worsened after a fall where she hit her head a couple months ago.  Since then she has had pain into the arms as well as her legs.  She also has had a holocephalic headache.  Her exam showed increased reflexes at the knees but was otherwise fairly normal.  We will check an MRI of the brain and cervical spine to help determine the etiology of her symptoms.  Based on the results of the MRI she might need referral for a spine procedure or surgery if there is evidence of myelopathy  To help with her symptoms, I started gabapentin 300 mg p.o. 3 times daily.  She will return to see [place] as needed for new or worsening symptoms or based on the results of the studies.  Thank you for asking me to see this patient.  Please let me know if I can be of further assistance  with her or other patients in the future.     Zia Kanner A. Epimenio Foot, MD, [facility] 08/25/2022, 10:41 AM Certified in Neurology, Clinical Neurophysiology, Sleep Medicine and  Neuroimaging  [facility] [address] [place], [place] [address] [phone]

## 2022-09-07 ENCOUNTER — Ambulatory Visit
Admission: RE | Admit: 2022-09-07 | Discharge: 2022-09-07 | Disposition: A | Discharge status: Home / Self Care | Payer: Medicare HMO | Source: Ambulatory Visit | Attending: Family Medicine | Admitting: Family Medicine

## 2022-09-07 DIAGNOSIS — Z1231 Encounter for screening mammogram for malignant neoplasm of breast: Secondary | ICD-10-CM

## 2022-09-11 ENCOUNTER — Other Ambulatory Visit: Payer: Self-pay | Admitting: Family Medicine

## 2022-09-11 ENCOUNTER — Other Ambulatory Visit (HOSPITAL_COMMUNITY)
Admission: RE | Admit: 2022-09-11 | Discharge: 2022-09-11 | Disposition: A | Discharge status: Home / Self Care | Payer: Medicare HMO | Source: Ambulatory Visit | Attending: Family Medicine | Admitting: Family Medicine

## 2022-09-11 DIAGNOSIS — F330 Major depressive disorder, recurrent, mild: Secondary | ICD-10-CM | POA: Diagnosis not present

## 2022-09-11 DIAGNOSIS — E559 Vitamin D deficiency, unspecified: Secondary | ICD-10-CM | POA: Diagnosis not present

## 2022-09-11 DIAGNOSIS — E7800 Pure hypercholesterolemia, unspecified: Secondary | ICD-10-CM | POA: Diagnosis not present

## 2022-09-11 DIAGNOSIS — Z1151 Encounter for screening for human papillomavirus (HPV): Secondary | ICD-10-CM | POA: Diagnosis not present

## 2022-09-11 DIAGNOSIS — Z0000 Encounter for general adult medical examination without abnormal findings: Secondary | ICD-10-CM | POA: Diagnosis not present

## 2022-09-11 DIAGNOSIS — I771 Stricture of artery: Secondary | ICD-10-CM | POA: Diagnosis not present

## 2022-09-11 DIAGNOSIS — Z01411 Encounter for gynecological examination (general) (routine) with abnormal findings: Secondary | ICD-10-CM | POA: Insufficient documentation

## 2022-09-11 DIAGNOSIS — E1142 Type 2 diabetes mellitus with diabetic polyneuropathy: Secondary | ICD-10-CM | POA: Diagnosis not present

## 2022-09-11 DIAGNOSIS — Z79899 Other long term (current) drug therapy: Secondary | ICD-10-CM | POA: Diagnosis not present

## 2022-09-11 DIAGNOSIS — M79642 Pain in left hand: Secondary | ICD-10-CM | POA: Diagnosis not present

## 2022-09-11 DIAGNOSIS — D709 Neutropenia, unspecified: Secondary | ICD-10-CM | POA: Diagnosis not present

## 2022-09-15 LAB — CYTOLOGY - PAP
Comment: NEGATIVE
Diagnosis: NEGATIVE
High risk HPV: NEGATIVE

## 2022-09-21 DIAGNOSIS — Z0100 Encounter for examination of eyes and vision without abnormal findings: Secondary | ICD-10-CM | POA: Diagnosis not present

## 2022-09-23 ENCOUNTER — Other Ambulatory Visit (HOSPITAL_BASED_OUTPATIENT_CLINIC_OR_DEPARTMENT_OTHER): Payer: Self-pay | Admitting: Family Medicine

## 2022-09-23 ENCOUNTER — Ambulatory Visit (HOSPITAL_BASED_OUTPATIENT_CLINIC_OR_DEPARTMENT_OTHER)
Admission: RE | Admit: 2022-09-23 | Discharge: 2022-09-23 | Disposition: A | Discharge status: Home / Self Care | Payer: Medicare HMO | Source: Ambulatory Visit | Attending: Family Medicine | Admitting: Family Medicine

## 2022-09-23 DIAGNOSIS — M79604 Pain in right leg: Secondary | ICD-10-CM | POA: Diagnosis not present

## 2022-09-23 DIAGNOSIS — M7989 Other specified soft tissue disorders: Secondary | ICD-10-CM | POA: Diagnosis not present

## 2022-09-23 DIAGNOSIS — Z0000 Encounter for general adult medical examination without abnormal findings: Secondary | ICD-10-CM | POA: Diagnosis not present

## 2022-09-23 DIAGNOSIS — I10 Essential (primary) hypertension: Secondary | ICD-10-CM | POA: Diagnosis not present

## 2022-09-23 DIAGNOSIS — M7121 Synovial cyst of popliteal space [Baker], right knee: Secondary | ICD-10-CM | POA: Diagnosis not present

## 2022-10-05 ENCOUNTER — Encounter: Payer: Self-pay | Admitting: Neurology

## 2022-10-06 ENCOUNTER — Other Ambulatory Visit: Payer: Medicare HMO

## 2022-10-06 DIAGNOSIS — R208 Other disturbances of skin sensation: Secondary | ICD-10-CM

## 2022-10-06 DIAGNOSIS — G44309 Post-traumatic headache, unspecified, not intractable: Secondary | ICD-10-CM | POA: Diagnosis not present

## 2022-10-06 DIAGNOSIS — R292 Abnormal reflex: Secondary | ICD-10-CM

## 2022-10-06 DIAGNOSIS — R519 Headache, unspecified: Secondary | ICD-10-CM | POA: Diagnosis not present

## 2022-10-06 DIAGNOSIS — W19XXXA Unspecified fall, initial encounter: Secondary | ICD-10-CM

## 2022-10-07 ENCOUNTER — Telehealth: Payer: Self-pay | Admitting: *Deleted

## 2022-10-07 NOTE — Telephone Encounter (Signed)
Left message for patient to call.

## 2022-10-07 NOTE — Telephone Encounter (Signed)
-----   Message from Asa Lente sent at 10/06/2022  9:38 PM EDT ----- Please let the patient know: The MRI of the brain showed some age-related changes.  She has more than his typical for age.  This is likely due to her hypertension.  This is not related to her headaches.  The MRI of the cervical spine showed some degenerative changes but nothing severe.

## 2022-10-12 NOTE — Telephone Encounter (Signed)
Patient  was informed with MRI results on 10/08/22

## 2022-11-26 IMAGING — MG MM DIGITAL SCREENING BILAT W/ TOMO AND CAD
8 series · 8 of 24 positions shown · non-contrast
Comparison: Previous exam(s).

CLINICAL DATA: Screening.

EXAM:
DIGITAL SCREENING BILATERAL MAMMOGRAM WITH TOMOSYNTHESIS AND CAD
TECHNIQUE: Bilateral screening digital craniocaudal and mediolateral oblique
mammograms were obtained. Bilateral screening digital breast
tomosynthesis was performed. The images were evaluated with
computer-aided detection.

[R MLO synth-2D]
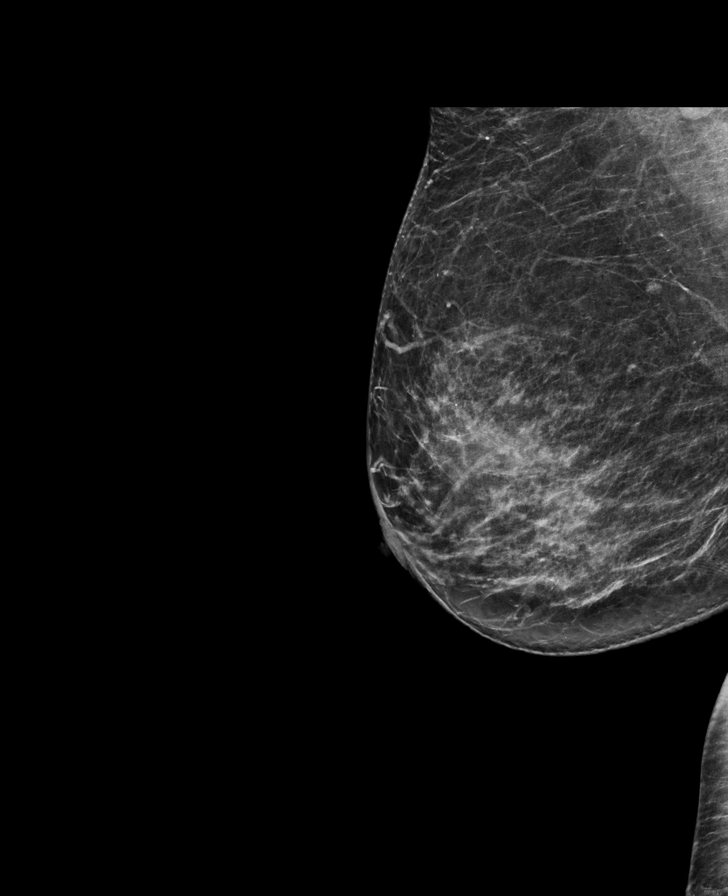

[R CC synth-2D]
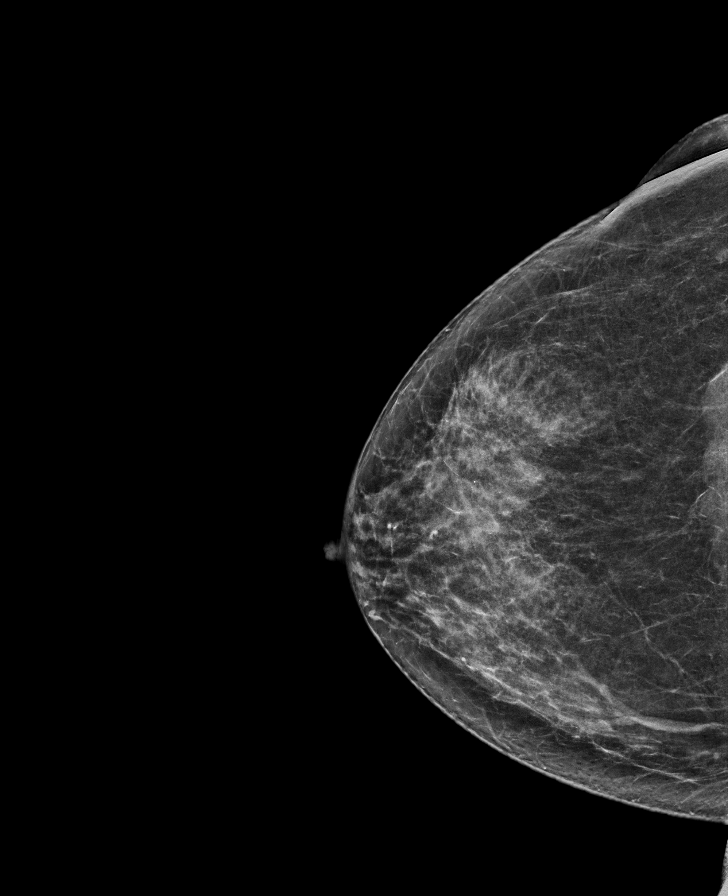

[L MLO synth-2D]
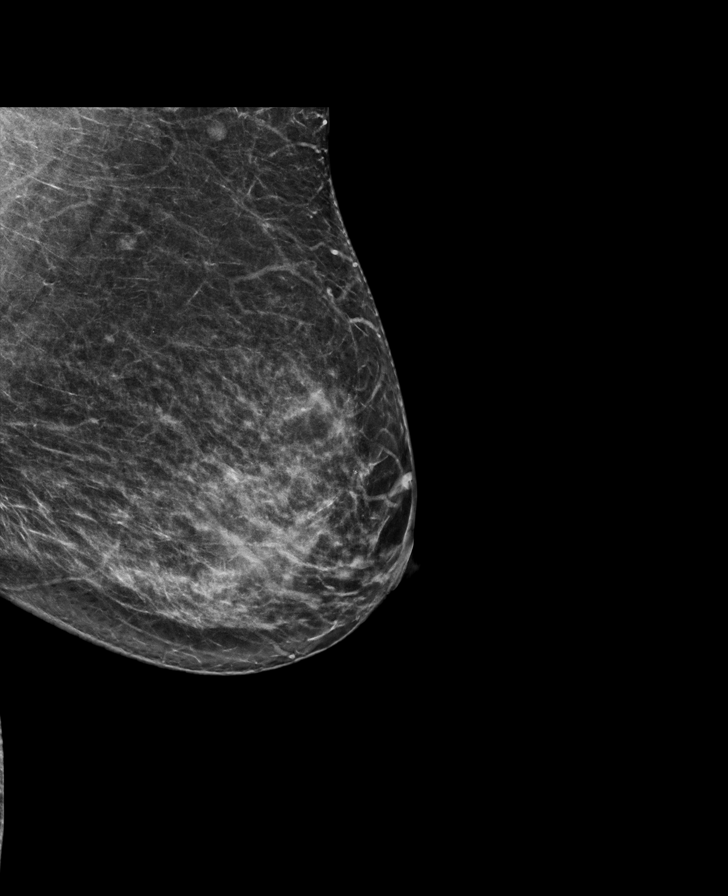

[L CC synth-2D]
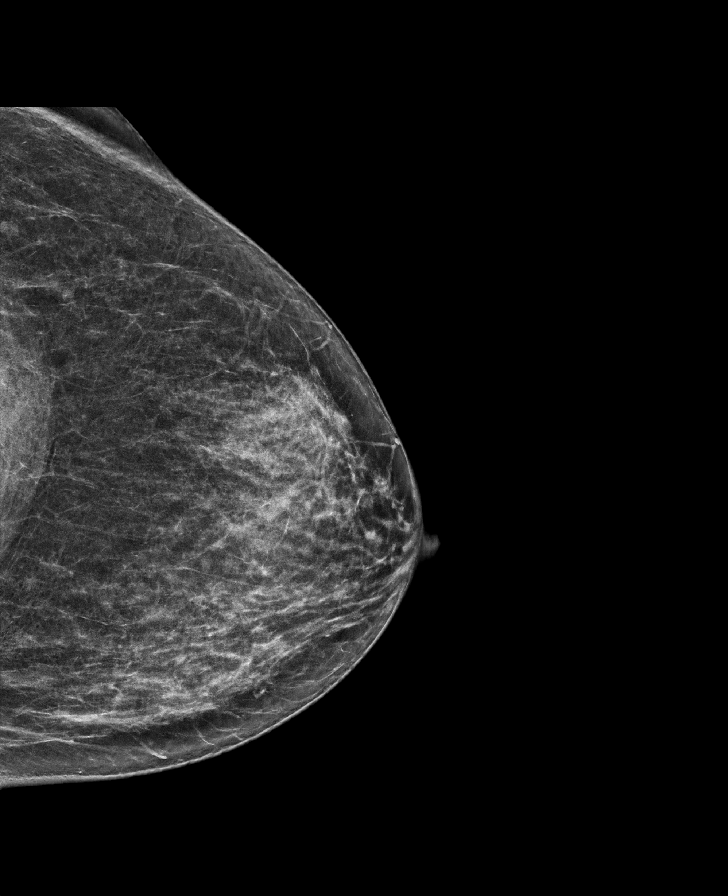

[L MLO tomo · tomo slice 35/70.0]
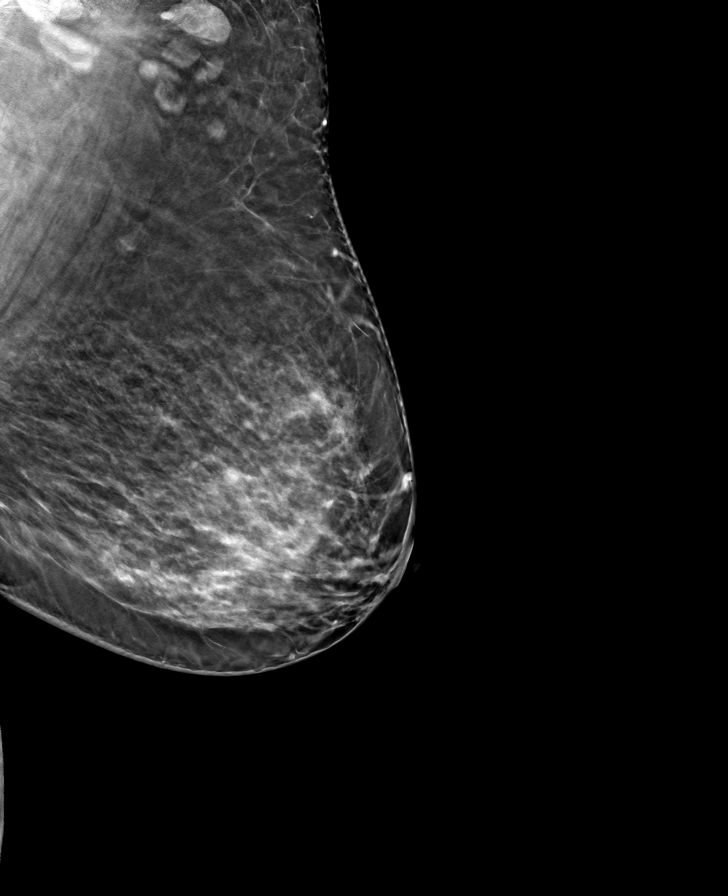

[R MLO tomo · tomo slice 35/70.0]
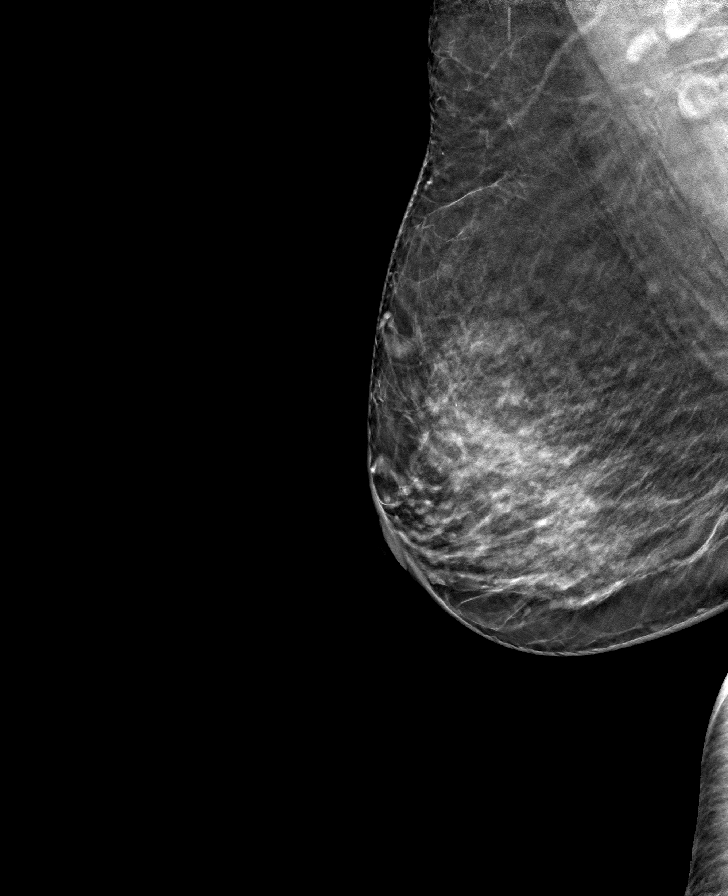

[L CC tomo · tomo slice 33/64.0]
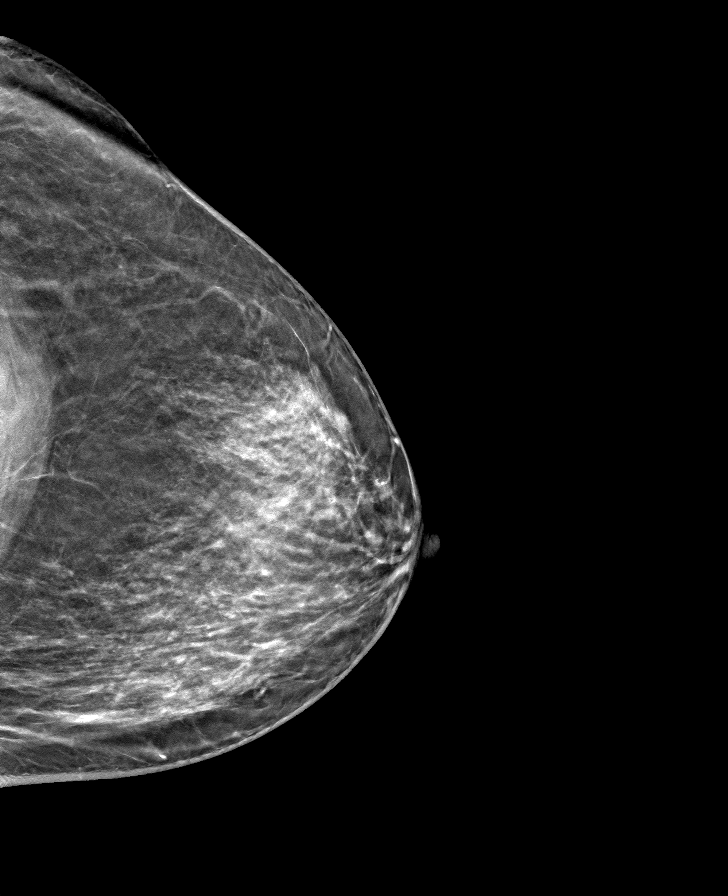

[R CC tomo · tomo slice 33/66.0]
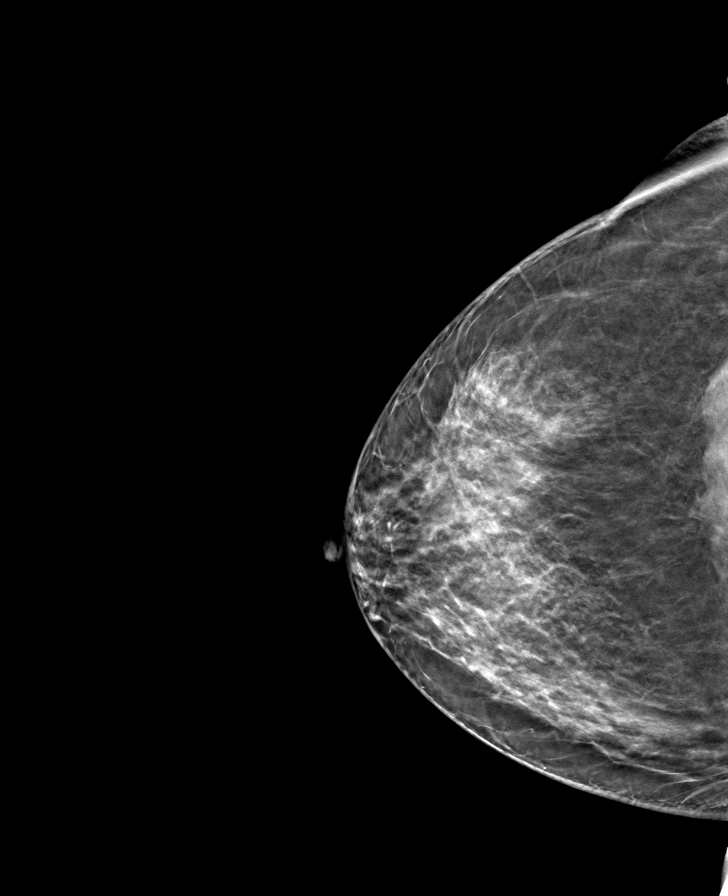

[8 of 24 positions shown; findings below may reference images not displayed]

ACR Breast Density Category c: The breast tissue is heterogeneously
dense, which may obscure small masses.
FINDINGS: There are no findings suspicious for malignancy.
IMPRESSION: No mammographic evidence of malignancy. A result letter of this
screening mammogram will be mailed directly to the patient.

RECOMMENDATION:
Screening mammogram in one year. (Code:Q3-W-BC3)

BI-RADS CATEGORY  1: Negative.

## 2022-11-29 IMAGING — CR DG CHEST 2V
2 series · 2 of 2 positions shown · non-contrast
Comparison: 03/27/2016

CLINICAL DATA: Pre-employment exam

EXAM:
CHEST - 2 VIEW

[w chest pa]
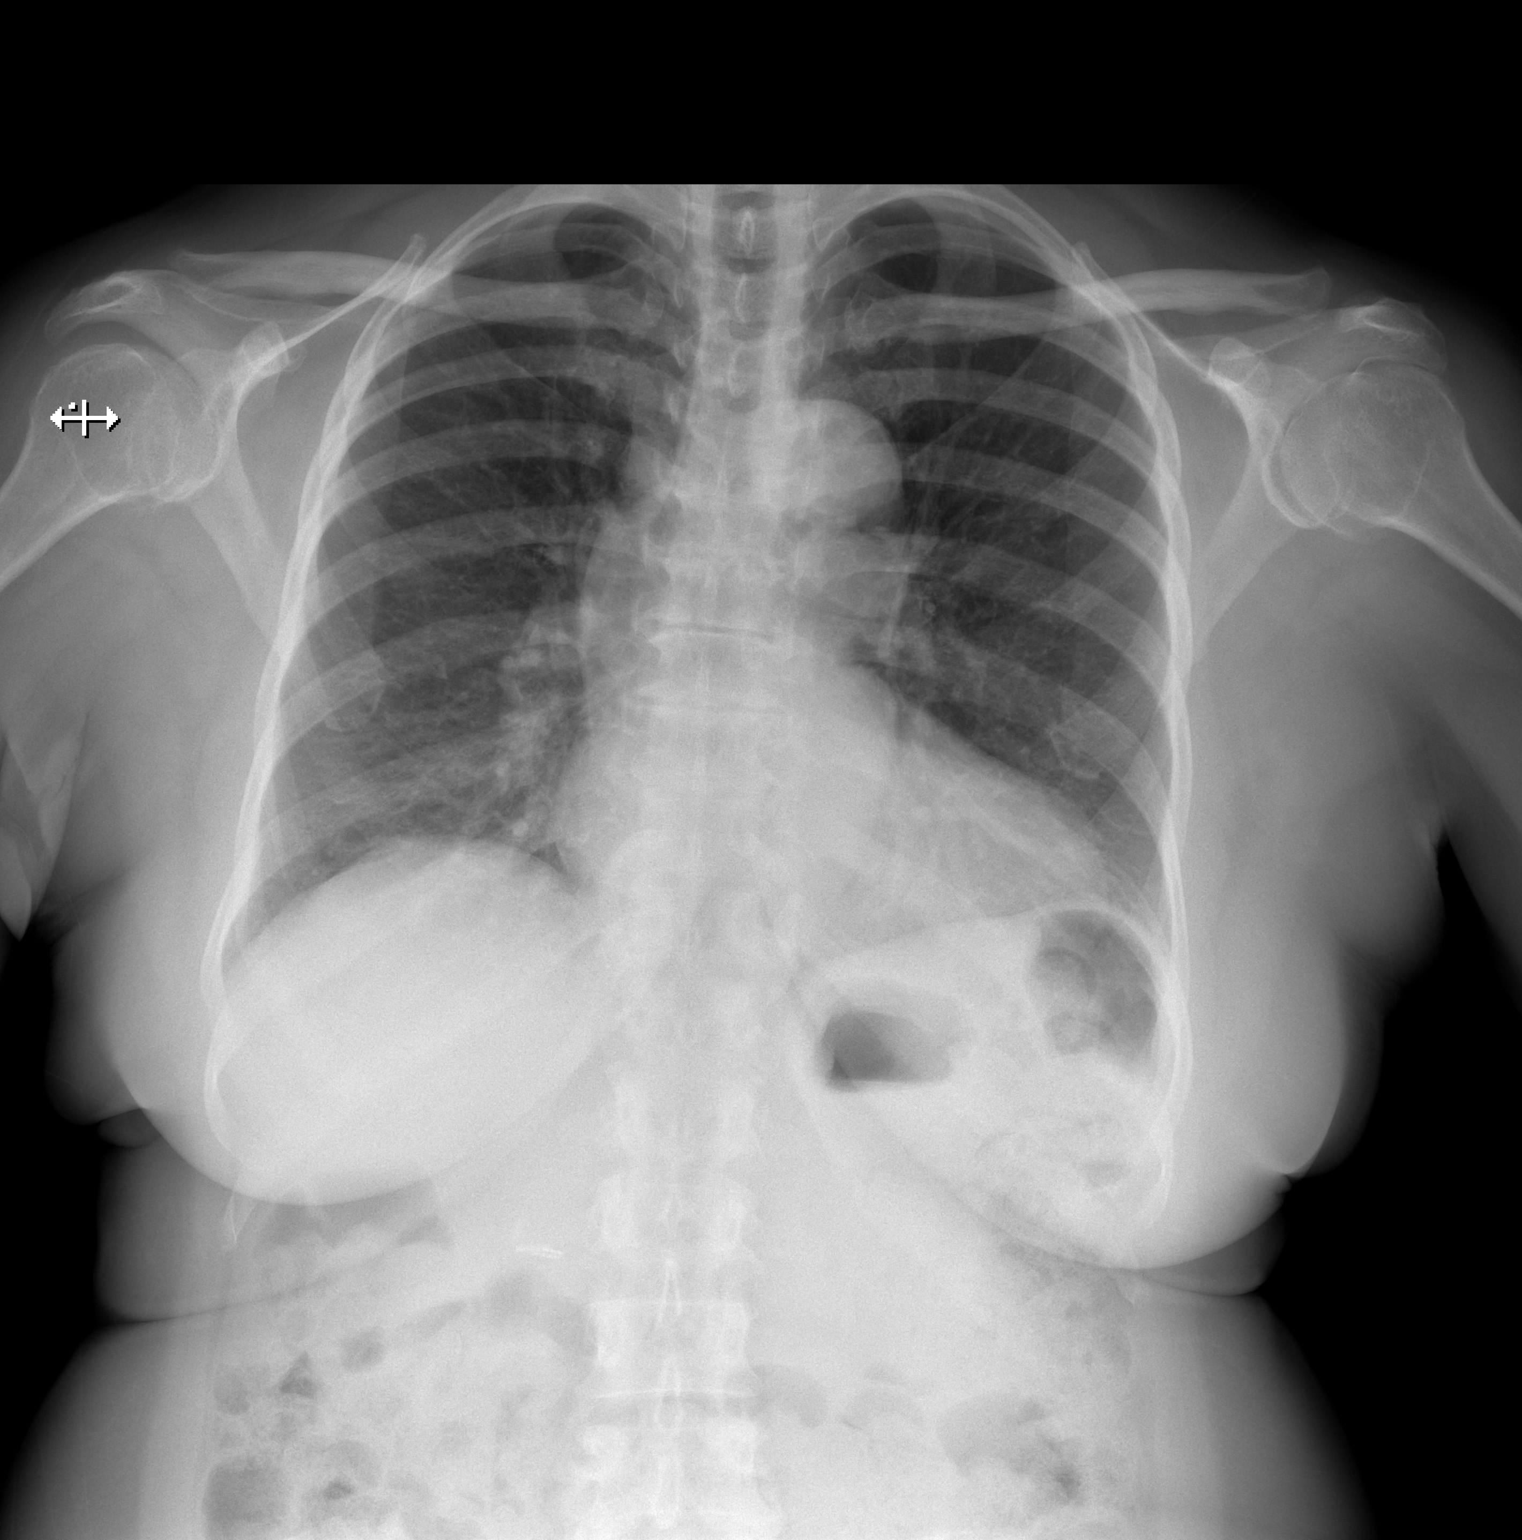

[w chest lat]
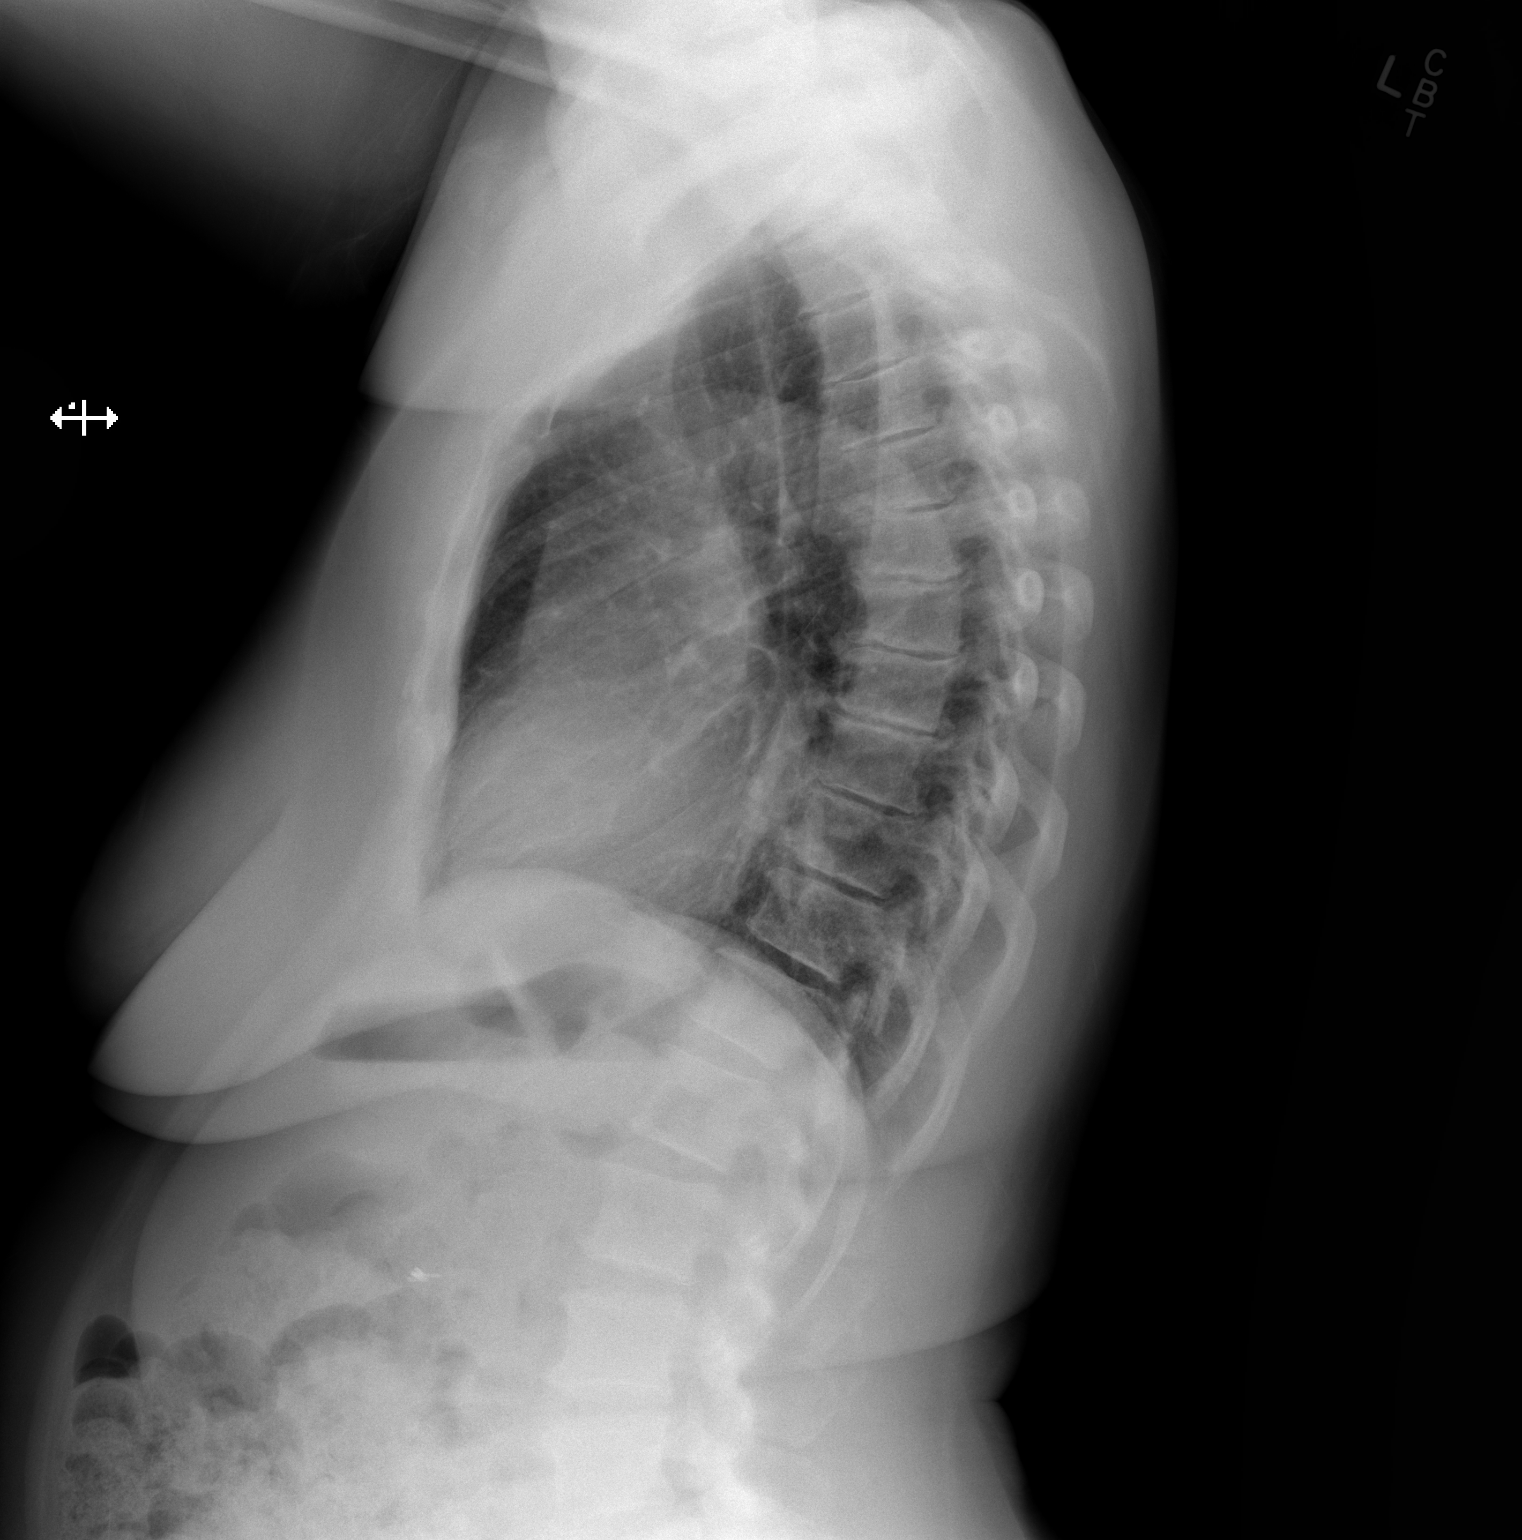

[2 of 2 positions shown; findings below may reference images not displayed]

FINDINGS: The heart size and mediastinal contours are within normal limits.
Both lungs are clear. The visualized skeletal structures show
degenerative changes of the thoracic spine.
IMPRESSION: No active cardiopulmonary disease.

## 2023-02-15 DIAGNOSIS — R059 Cough, unspecified: Secondary | ICD-10-CM | POA: Diagnosis not present

## 2023-02-15 DIAGNOSIS — E1142 Type 2 diabetes mellitus with diabetic polyneuropathy: Secondary | ICD-10-CM | POA: Diagnosis not present

## 2023-02-15 DIAGNOSIS — I10 Essential (primary) hypertension: Secondary | ICD-10-CM | POA: Diagnosis not present

## 2023-08-04 ENCOUNTER — Other Ambulatory Visit: Payer: Self-pay | Admitting: Family Medicine

## 2023-08-04 DIAGNOSIS — Z1231 Encounter for screening mammogram for malignant neoplasm of breast: Secondary | ICD-10-CM

## 2023-09-13 DIAGNOSIS — D700 Congenital agranulocytosis: Secondary | ICD-10-CM | POA: Diagnosis not present

## 2023-09-13 DIAGNOSIS — Z79899 Other long term (current) drug therapy: Secondary | ICD-10-CM | POA: Diagnosis not present

## 2023-09-13 DIAGNOSIS — E559 Vitamin D deficiency, unspecified: Secondary | ICD-10-CM | POA: Diagnosis not present

## 2023-09-13 DIAGNOSIS — Z0000 Encounter for general adult medical examination without abnormal findings: Secondary | ICD-10-CM | POA: Diagnosis not present

## 2023-09-13 DIAGNOSIS — I771 Stricture of artery: Secondary | ICD-10-CM | POA: Diagnosis not present

## 2023-09-13 DIAGNOSIS — E7800 Pure hypercholesterolemia, unspecified: Secondary | ICD-10-CM | POA: Diagnosis not present

## 2023-09-13 DIAGNOSIS — E1142 Type 2 diabetes mellitus with diabetic polyneuropathy: Secondary | ICD-10-CM | POA: Diagnosis not present

## 2023-09-13 DIAGNOSIS — R202 Paresthesia of skin: Secondary | ICD-10-CM | POA: Diagnosis not present

## 2023-09-20 DIAGNOSIS — N898 Other specified noninflammatory disorders of vagina: Secondary | ICD-10-CM | POA: Diagnosis not present

## 2023-09-20 DIAGNOSIS — B3789 Other sites of candidiasis: Secondary | ICD-10-CM | POA: Diagnosis not present

## 2023-09-21 ENCOUNTER — Ambulatory Visit
Admission: RE | Admit: 2023-09-21 | Discharge: 2023-09-21 | Disposition: A | Discharge status: Home / Self Care | Source: Ambulatory Visit | Attending: Family Medicine | Admitting: Family Medicine

## 2023-09-21 DIAGNOSIS — Z1231 Encounter for screening mammogram for malignant neoplasm of breast: Secondary | ICD-10-CM

## 2023-09-22 ENCOUNTER — Encounter: Payer: Self-pay | Admitting: Podiatry

## 2023-09-22 ENCOUNTER — Ambulatory Visit: Admitting: Podiatry

## 2023-09-22 DIAGNOSIS — E1142 Type 2 diabetes mellitus with diabetic polyneuropathy: Secondary | ICD-10-CM | POA: Diagnosis not present

## 2023-09-22 DIAGNOSIS — G5790 Unspecified mononeuropathy of unspecified lower limb: Secondary | ICD-10-CM

## 2023-09-22 MED ORDER — GABAPENTIN 300 MG PO CAPS: ORAL_CAPSULE | ORAL | Status: AC

## 2023-09-22 NOTE — Progress Notes (Signed)
 Presents today with numbness and burning on her feet bilaterally.  Tends to be worse at night.  She is a type II diabetic.  Has not noticed any coloration changes in the feet when this happens .   Physical exam:  General appearance: Pleasant, and in no acute distress. AOx3.  Vascular: Pedal pulses: DP 2/4 bilaterally, PT 2/4 bilaterally.  Moderate edema lower legs bilaterally. Capillary fill time immediate bilateral.  Neurological: Light touch intact feet bilaterally.  Normal Achilles reflex bilaterally.  No clonus or spasticity noted.  Decreased vibratory sensation feet bilaterally.  Some hyperesthesias in the right foot.  Monofilament sensation intact.  Negative Tinel's sign tarsal tunnel, porta pedis, and cutaneous peripheral nerve process around foot and ankle bilaterally  Dermatologic:   Skin normal temperature bilaterally.  Some atrophy of skin with no hair growth lower extremity bilaterally  Musculoskeletal: No tenderness to palpation along the arch of the foot or dorsum of the foot.  Normal range of motion ankle joints.  Muscle strength foot and ankle bilaterally   Diagnosis: 1.  Neuritis bilaterally feet 2.  Diabetes mellitus type 2 with neuropathy  Plan: -New patient office visit for evaluation and management level 3. - Discussed with her neuropathy neuropathy and etiology and treatment.  Discussed with her precautions she needs to take as a diabetic with neuropathy.  Will start her on gabapentin  and see how she does with this for relief. -Rx gabapentin  300 mg 3 times daily.  Taper dose starting at 1 a day at night for the first week and then tapering up from there   Return 3 months follow-up neuropathy

## 2024-03-10 ENCOUNTER — Ambulatory Visit: Admitting: Podiatry
